# Patient Record
Sex: Female | Born: 1955
Health system: Southern US, Community
[De-identification: ages and names within clinical notes are randomized; demographics above are authoritative.]

## PROBLEM LIST (undated history)

## (undated) DIAGNOSIS — IMO0001 Reserved for inherently not codable concepts without codable children: Secondary | ICD-10-CM

## (undated) DIAGNOSIS — Z87442 Personal history of urinary calculi: Secondary | ICD-10-CM

## (undated) DIAGNOSIS — G4733 Obstructive sleep apnea (adult) (pediatric): Secondary | ICD-10-CM

## (undated) DIAGNOSIS — F32A Depression, unspecified: Secondary | ICD-10-CM

## (undated) DIAGNOSIS — M1990 Unspecified osteoarthritis, unspecified site: Secondary | ICD-10-CM

## (undated) DIAGNOSIS — E119 Type 2 diabetes mellitus without complications: Secondary | ICD-10-CM

## (undated) DIAGNOSIS — T7840XA Allergy, unspecified, initial encounter: Secondary | ICD-10-CM

## (undated) DIAGNOSIS — F329 Major depressive disorder, single episode, unspecified: Secondary | ICD-10-CM

## (undated) DIAGNOSIS — Z531 Procedure and treatment not carried out because of patient's decision for reasons of belief and group pressure: Secondary | ICD-10-CM

## (undated) DIAGNOSIS — J45909 Unspecified asthma, uncomplicated: Secondary | ICD-10-CM

## (undated) DIAGNOSIS — Z860100 Personal history of colon polyps, unspecified: Secondary | ICD-10-CM

## (undated) HISTORY — DX: Allergy, unspecified, initial encounter: T78.40XA

## (undated) HISTORY — PX: BACK SURGERY: SHX140

## (undated) HISTORY — DX: Personal history of urinary calculi: Z87.442

## (undated) HISTORY — PX: OTHER SURGICAL HISTORY: SHX169

## (undated) HISTORY — DX: Personal history of colon polyps, unspecified: Z86.0100

## (undated) HISTORY — DX: Unspecified asthma, uncomplicated: J45.909

## (undated) HISTORY — DX: Major depressive disorder, single episode, unspecified: F32.9

## (undated) HISTORY — DX: Obstructive sleep apnea (adult) (pediatric): G47.33

## (undated) HISTORY — DX: Personal history of colonic polyps: Z86.010

## (undated) HISTORY — DX: Unspecified osteoarthritis, unspecified site: M19.90

## (undated) HISTORY — PX: DILATION AND CURETTAGE OF UTERUS: SHX78

## (undated) HISTORY — PX: TONSILLECTOMY: SUR1361

## (undated) HISTORY — DX: Reserved for inherently not codable concepts without codable children: IMO0001

## (undated) HISTORY — DX: Procedure and treatment not carried out because of patient's decision for reasons of belief and group pressure: Z53.1

## (undated) HISTORY — DX: Depression, unspecified: F32.A

---
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
## 1998-02-09 ENCOUNTER — Other Ambulatory Visit: Admission: RE | Admit: 1998-02-09 | Discharge: 1998-02-09 | Payer: Self-pay | Admitting: *Deleted

## 1998-06-27 ENCOUNTER — Emergency Department (HOSPITAL_COMMUNITY): Admission: EM | Admit: 1998-06-27 | Discharge: 1998-06-27 | Payer: Self-pay | Admitting: Emergency Medicine

## 1998-09-28 ENCOUNTER — Ambulatory Visit (HOSPITAL_COMMUNITY): Admission: RE | Admit: 1998-09-28 | Discharge: 1998-09-28 | Payer: Self-pay | Admitting: Psychiatry

## 1998-10-06 ENCOUNTER — Emergency Department (HOSPITAL_COMMUNITY): Admission: EM | Admit: 1998-10-06 | Discharge: 1998-10-06 | Payer: Self-pay | Admitting: Emergency Medicine

## 1998-10-23 ENCOUNTER — Ambulatory Visit (HOSPITAL_COMMUNITY): Admission: RE | Admit: 1998-10-23 | Discharge: 1998-10-23 | Payer: Self-pay

## 1999-05-04 ENCOUNTER — Emergency Department (HOSPITAL_COMMUNITY): Admission: EM | Admit: 1999-05-04 | Discharge: 1999-05-04 | Payer: Self-pay | Admitting: Emergency Medicine

## 1999-07-08 ENCOUNTER — Encounter: Admission: RE | Admit: 1999-07-08 | Discharge: 1999-09-27 | Payer: Self-pay | Admitting: Anesthesiology

## 1999-07-16 ENCOUNTER — Encounter (INDEPENDENT_AMBULATORY_CARE_PROVIDER_SITE_OTHER): Payer: Self-pay

## 1999-07-16 ENCOUNTER — Ambulatory Visit (HOSPITAL_COMMUNITY): Admission: RE | Admit: 1999-07-16 | Discharge: 1999-07-16 | Payer: Self-pay | Admitting: *Deleted

## 1999-08-07 ENCOUNTER — Ambulatory Visit (HOSPITAL_COMMUNITY): Admission: RE | Admit: 1999-08-07 | Discharge: 1999-08-07 | Payer: Self-pay | Admitting: Internal Medicine

## 1999-08-07 ENCOUNTER — Encounter: Payer: Self-pay | Admitting: Internal Medicine

## 1999-09-27 ENCOUNTER — Encounter: Admission: RE | Admit: 1999-09-27 | Discharge: 1999-12-06 | Payer: Self-pay | Admitting: Anesthesiology

## 1999-10-18 ENCOUNTER — Ambulatory Visit (HOSPITAL_COMMUNITY): Admission: RE | Admit: 1999-10-18 | Discharge: 1999-10-18 | Payer: Self-pay | Admitting: *Deleted

## 1999-10-26 ENCOUNTER — Emergency Department (HOSPITAL_COMMUNITY): Admission: EM | Admit: 1999-10-26 | Discharge: 1999-10-26 | Payer: Self-pay | Admitting: Emergency Medicine

## 1999-11-14 ENCOUNTER — Encounter: Payer: Self-pay | Admitting: *Deleted

## 1999-11-14 ENCOUNTER — Ambulatory Visit (HOSPITAL_COMMUNITY): Admission: RE | Admit: 1999-11-14 | Discharge: 1999-11-14 | Payer: Self-pay | Admitting: *Deleted

## 2000-01-17 ENCOUNTER — Encounter: Admission: RE | Admit: 2000-01-17 | Discharge: 2000-04-16 | Payer: Self-pay | Admitting: Anesthesiology

## 2000-01-19 ENCOUNTER — Ambulatory Visit (HOSPITAL_COMMUNITY): Admission: RE | Admit: 2000-01-19 | Discharge: 2000-01-19 | Payer: Self-pay | Admitting: Internal Medicine

## 2000-01-19 ENCOUNTER — Encounter: Payer: Self-pay | Admitting: Internal Medicine

## 2000-04-16 ENCOUNTER — Encounter: Admission: RE | Admit: 2000-04-16 | Discharge: 2000-07-15 | Payer: Self-pay | Admitting: Anesthesiology

## 2000-07-28 ENCOUNTER — Encounter: Admission: RE | Admit: 2000-07-28 | Discharge: 2000-10-26 | Payer: Self-pay | Admitting: Anesthesiology

## 2000-12-08 ENCOUNTER — Encounter: Admission: RE | Admit: 2000-12-08 | Discharge: 2001-03-08 | Payer: Self-pay | Admitting: Anesthesiology

## 2001-04-06 ENCOUNTER — Encounter: Admission: RE | Admit: 2001-04-06 | Discharge: 2001-05-15 | Payer: Self-pay | Admitting: Anesthesiology

## 2002-02-21 ENCOUNTER — Other Ambulatory Visit: Admission: RE | Admit: 2002-02-21 | Discharge: 2002-02-21 | Payer: Self-pay | Admitting: *Deleted

## 2002-03-04 ENCOUNTER — Ambulatory Visit (HOSPITAL_COMMUNITY): Admission: RE | Admit: 2002-03-04 | Discharge: 2002-03-04 | Payer: Self-pay | Admitting: *Deleted

## 2003-03-09 ENCOUNTER — Other Ambulatory Visit: Admission: RE | Admit: 2003-03-09 | Discharge: 2003-03-09 | Payer: Self-pay | Admitting: *Deleted

## 2003-03-31 ENCOUNTER — Ambulatory Visit (HOSPITAL_COMMUNITY): Admission: RE | Admit: 2003-03-31 | Discharge: 2003-03-31 | Payer: Self-pay | Admitting: *Deleted

## 2006-03-11 ENCOUNTER — Ambulatory Visit (HOSPITAL_COMMUNITY): Admission: RE | Admit: 2006-03-11 | Discharge: 2006-03-11 | Payer: Self-pay | Admitting: *Deleted

## 2010-06-21 ENCOUNTER — Encounter: Admission: RE | Admit: 2010-06-21 | Discharge: 2010-06-21 | Payer: Self-pay | Admitting: Anesthesiology

## 2010-10-05 ENCOUNTER — Encounter: Payer: Self-pay | Admitting: Anesthesiology

## 2011-01-31 NOTE — H&P (Signed)
[facility]  Patient:    Makayla Olson, RAWSON Visit Number: [id number] MRN: [id number]          Service Type: PMG Location: [facility] Attending Physician:  Thyra Breed Adm. Date:  [id number]   CC:         Eliezer Bottom, M.D.  Jaye Beagle, M.D.   History and Physical  FOLLOW-UP EVALUATION  Myrka comes in for follow-up evaluation of her chronic low back pain and status post fusion for spondylosis.  Since her last evaluation she continues to have some burning dysesthesias in the left ankle and left lateral thigh, as well as lower back discomfort, but this is little change from previously.  It is made worse by being up and being active and she has been under a lot of stress with things breaking around the house, emotional problems with her daughter, and depression.  She notes that ice, heat, and laying down as well as her medications helps her reduce her discomfort.  CURRENT MEDICATIONS: 1. OxyContin 40 mg twice a day with 20 mg at midday. 2. Neurontin 100 mg three tablets three times a day. 3. Zoloft 200 mg per day. 4. Ibuprofen p.r.n. which has caused some abdominal discomfort, but no    bleeding and has been resolved by taking some vitamin supplementation.  PHYSICAL EXAMINATION:  VITAL SIGNS:  Blood pressure is 122/77, heart rate is 94, respiratory rate is 16, O2 saturations 97%.  Pain level is 5/10.  EXTREMITIES/NEUROLOGIC:  Straight leg raise signs were negative.  Deep tendon reflexes were symmetric at the knees and ankles.  She has minimal weakness of the left EHL.  IMPRESSION: 1. Low back pain, status post fusion with mild radiculopathy to the left lower    extremity. 2. Depression per Dr. Evelene Croon.  DISPOSITION: 1. Continue on OxyContin 40 mg one twice a day, #60, with 20 mg at midday,    #30. 2. Continue with Neurontin 100 mg three tablets q.8h., #200, with three    refills. 3. Ibuprofen 800 mg one p.o. q.8h., #100, with two  refills. 4. Follow up with me in eight weeks. DD:  05/03/01 TD:  05/03/01 Job: [id number]

## 2011-01-31 NOTE — Consult Note (Signed)
[facility]  Patient:    Makayla Olson, Makayla Olson                     MRN: [id number] Proc. Date: 03/20/00 Adm. Date:  [id number] Attending:  Thyra Breed CC:         Soyla Murphy. Renne Crigler, M.D.             Jaye Beagle, M.D.                          Consultation Report  FOLLOW-UP EVALUATION  HISTORY OF PRESENT ILLNESS:  Wanza comes in for follow-up evaluation of her chronic low back pain with lumbar radiculopathy. Since her previous evaluation, the patient has been evaluated by Dr. Jaye Beagle who is concerned that she may be suffering from a pseudoarthrosis at L5-S1. He has requested that we go ahead and set her up for an L5 selective nerve root block and follow this with an S1 selective nerve root block to ascertain the source of her discomfort.  She continues on the Neurontin 100 mg 2 tablets 3-4 times per day, Clonopin, OxyContin 20 mg b.i.d. with an occasional 10 mg at midday. She does get somewhat nauseated by the medications and sedated but is tolerating this well otherwise. She has no new neurologic symptoms.  PHYSICAL EXAMINATION:  VITAL SIGNS:  Blood pressure 121/55, heart rate 94, respiratory rate 16, O2 saturations 96%, pain level 4/10 and temperature is 98.8.  NEUROLOGIC:  Unchanged from her last visit.  IMPRESSION: 1. Lumbar radiculopathy with chronic low back pain. 2. Dyspepsia and abdominal discomfort. 3. Other medical problems per Dr. Renne Crigler.  DISPOSITION: 1. OxyContin 20 mg 1 p.o. b.i.d. #60 with no refill, OxyContin 10 mg 1 p.o.    midday #30 with no refill. 2. Neurontin 100 mg 2 p.o. q. 6h p.r.n. #100 with 8 refills. 3. Continue on Clonopin. 4. We will go ahead and schedule her initially for an L5 selective    nerve root block and follow this with an S1 if she does not have    significant resolution in her discomfort. DD:  03/20/00 TD:  03/23/00 Job: [id number]

## 2011-01-31 NOTE — Procedures (Signed)
[facility]  Patient:    Makayla Olson, Makayla Olson                     MRN: [id number] Proc. Date: 05/15/00 Adm. Date:  [id number] Attending:  Thyra Breed CC:         Dr. Lyn Hollingshead   Procedure Report  PROCEDURE:  Occipital nerve block.  ANESTHESIOLOGIST:  Thyra Breed, M.D.  DIAGNOSIS:  Occipital neuralgia with headache and lumbar radiculopathy with underlying lumbar spondylosis.  INTERVAL HISTORY:  The patient is noting increasing lower back discomfort radiating out into the left lower extremity.  She states that her discomfort is relatively stable with regard to the occipital neuralgia, although she is beginning to get a tweak of increased discomfort.  She has been applying ice and heat to her neck.  She notes that her sciatica seems much more prominent than in the past.  EXAMINATION:  The patient is afebrile with vital signs stable; please see flow sheet.  The patient demonstrates brisk, symmetric reflexes in the lower extremity with motor 5/5.  She exhibits tenderness of the left occipital groove with good range of motion of her neck and no change in her neurological exam of the upper extremities.  CURRENT MEDICATIONS 1. OxyContin 20 mg q.8h. 2. Neurontin 100 mg two tablets four times a day.  DESCRIPTION OF PROCEDURE:  After informed consent was obtained, the left greater occipital region was prepped with Betadine x 3.  Using a 25-gauge needle, I injected 3 cc of 1% lidocaine in a field distribution nerve block. Fifteen minutes later, the patient noted marked attenuation in discomfort. The needle that was used for the procedure was removed intact.  DISPOSITION 1. Resume previous diet. 2. Limitation in activities per instruction sheet. 3. Continue on current medications with prescriptions written for OxyContin    20 mg one p.o. t.i.d., #90 with no refill, and Neurontin to be taken as    nine tablets per day; she was given prescription for #300  with two refills. 4. Follow up with me in four weeks. DD:  05/15/00 TD:  05/18/00 Job: [id number]

## 2011-01-31 NOTE — Op Note (Signed)
NAMEGAGE, TREIBER                        ACCOUNT NO.:  [id number]   MEDICAL RECORD NO.:  [id number]                   PATIENT TYPE:  AMB   LOCATION:  SDC                                  FACILITY:  [facility]   PHYSICIAN:  Pershing Cox, M.D.            DATE OF BIRTH:  [birth year]   DATE OF PROCEDURE:  03/31/2003  DATE OF DISCHARGE:                                 OPERATIVE REPORT   ANESTHESIA:  General by LMA.   SURGEON:  Pershing Cox, M.D.   INDICATIONS FOR PROCEDURE:  [name] is 55 years old.  She has a  long-standing problem with menorrhagia.  She has had an endometrial ablation  in the past and this year had a hysteroscopy showing complete atrophy of the  endometrium.  Because her bleeding persists, she returns to the operating  room today for repeat cryoablation in hopes that this will stop her bleeding  until she enters menopause. This procedure was done under ultrasound  guidance to be certain that no perforation occurred.   FINDINGS:  Ultrasound shows a small uterus.  The cavity is sounded to 7 cm.  There was no evidence of perforation.   PROCEDURE:  Makayla Olson was brought to the operating room with an IV in  place.  She received a gram of Ancef in the holding area.  Supine on the OR  table, she was allowed to position herself into [place] stirrups to make sure  there was no discomfort in her lower back.  Once she was comfortable, IV  sedation was administered to allow for placement of an LMA.  She was then  prepped with a solution of Hibiclens.  She was draped for a vaginal  procedure and a Foley catheter was sterilely inserted into the bladder.  A  transabdominal ultrasound was performed to allow [place] to visualize the lower  uterine segment and cervix.  This required approximately 150 mL of fluid  injected into the Foley catheter.  A bivalve speculum exposed the cervix.  0.25% Marcaine was injected into the anterior cervix which was grasped with  a single tooth tenaculum.  Marcaine paracervical block was administered at  the 3, 4, 7, and 8 positions using a total volume of 12 mL.  The uterine  sound passed easily into the endometrial cavity under ultrasound guidance.  There was no evidence of perforation.  Serial Pratt dilators were then used  to dilate the cervix to size 25.  Once the dilatation had been completed,  the bivalve speculum was removed and the Sims retractor was used to retract  the posterior vagina.  The probe was precooled for 3 minutes then inserted  into the cavity, first in the direction of the left cornua.  A six minute  freeze was carried out followed by 1 1/2 minutes thaw.  The probe was  repositioned into the right cornua and a six minute freeze was followed by 1  1/2 minute thaw.  The probe was removed.  The tenaculum was removed.  The  patient was extubated and taken to the recovery room in excellent condition.  She had received a gram of Ancef in the holding area for preop antibiotics.                                               Pershing Cox, M.D.    MAJ/MEDQ  D:  03/31/2003  T:  04/01/2003  Job:  [id number]

## 2012-03-01 ENCOUNTER — Other Ambulatory Visit (HOSPITAL_COMMUNITY): Payer: Self-pay | Admitting: Obstetrics

## 2012-03-01 DIAGNOSIS — Z1231 Encounter for screening mammogram for malignant neoplasm of breast: Secondary | ICD-10-CM

## 2012-03-15 LAB — HM PAP SMEAR: HM Pap smear: NORMAL

## 2012-03-24 ENCOUNTER — Ambulatory Visit (HOSPITAL_COMMUNITY)
Admission: RE | Admit: 2012-03-24 | Discharge: 2012-03-24 | Disposition: A | Discharge status: Home / Self Care | Payer: Medicare Other | Source: Ambulatory Visit | Attending: Obstetrics | Admitting: Obstetrics

## 2012-03-24 DIAGNOSIS — Z1231 Encounter for screening mammogram for malignant neoplasm of breast: Secondary | ICD-10-CM | POA: Insufficient documentation

## 2012-12-06 ENCOUNTER — Other Ambulatory Visit: Payer: Self-pay | Admitting: Orthopedic Surgery

## 2012-12-06 DIAGNOSIS — M545 Low back pain: Secondary | ICD-10-CM

## 2012-12-08 ENCOUNTER — Other Ambulatory Visit: Payer: Medicare Other

## 2012-12-15 ENCOUNTER — Ambulatory Visit
Admission: RE | Admit: 2012-12-15 | Discharge: 2012-12-15 | Disposition: A | Discharge status: Home / Self Care | Payer: Medicare Other | Source: Ambulatory Visit | Attending: Orthopedic Surgery | Admitting: Orthopedic Surgery

## 2012-12-15 DIAGNOSIS — M545 Low back pain: Secondary | ICD-10-CM

## 2013-03-14 ENCOUNTER — Emergency Department (HOSPITAL_COMMUNITY): Payer: Medicare Other

## 2013-03-14 ENCOUNTER — Emergency Department (HOSPITAL_COMMUNITY)
Admission: EM | Admit: 2013-03-14 | Discharge: 2013-03-14 | Disposition: A | Discharge status: Home / Self Care | Payer: Medicare Other | Attending: Emergency Medicine | Admitting: Emergency Medicine

## 2013-03-14 ENCOUNTER — Encounter (HOSPITAL_COMMUNITY): Payer: Self-pay | Admitting: *Deleted

## 2013-03-14 DIAGNOSIS — K5900 Constipation, unspecified: Secondary | ICD-10-CM | POA: Insufficient documentation

## 2013-03-14 DIAGNOSIS — Z79899 Other long term (current) drug therapy: Secondary | ICD-10-CM | POA: Insufficient documentation

## 2013-03-14 DIAGNOSIS — R110 Nausea: Secondary | ICD-10-CM | POA: Insufficient documentation

## 2013-03-14 DIAGNOSIS — R109 Unspecified abdominal pain: Secondary | ICD-10-CM | POA: Insufficient documentation

## 2013-03-14 DIAGNOSIS — R34 Anuria and oliguria: Secondary | ICD-10-CM | POA: Insufficient documentation

## 2013-03-14 DIAGNOSIS — E119 Type 2 diabetes mellitus without complications: Secondary | ICD-10-CM | POA: Insufficient documentation

## 2013-03-14 HISTORY — DX: Type 2 diabetes mellitus without complications: E11.9

## 2013-03-14 LAB — CBC WITH DIFFERENTIAL/PLATELET
Eosinophils Absolute: 0.2 10*3/uL (ref 0.0–0.7)
HCT: 41 % (ref 36.0–46.0)
Hemoglobin: 13.8 g/dL (ref 12.0–15.0)
Lymphs Abs: 1.6 10*3/uL (ref 0.7–4.0)
MCH: 27.9 pg (ref 26.0–34.0)
Monocytes Absolute: 0.4 10*3/uL (ref 0.1–1.0)
Monocytes Relative: 7 % (ref 3–12)
Neutrophils Relative %: 59 % (ref 43–77)
RBC: 4.94 MIL/uL (ref 3.87–5.11)

## 2013-03-14 LAB — BASIC METABOLIC PANEL
BUN: 17 mg/dL (ref 6–23)
Chloride: 102 mEq/L (ref 96–112)
GFR calc non Af Amer: >90 mL/min (ref >90)
Glucose, Bld: 187 mg/dL — ABNORMAL HIGH (ref 70–99)
Potassium: 4.2 mEq/L (ref 3.5–5.1)

## 2013-03-14 LAB — URINALYSIS, ROUTINE W REFLEX MICROSCOPIC
Bilirubin Urine: NEGATIVE
Glucose, UA: NEGATIVE mg/dL
Hgb urine dipstick: NEGATIVE
Nitrite: NEGATIVE
Specific Gravity, Urine: 1.022 (ref 1.005–1.030)
pH: 5 (ref 5.0–8.0)

## 2013-03-14 LAB — URINE MICROSCOPIC-ADD ON

## 2013-03-14 MED ORDER — ONDANSETRON HCL 4 MG/2ML IJ SOLN
4.0000 mg | Freq: Once | INTRAMUSCULAR | Status: AC
  Administered 2013-03-14: 4 mg via INTRAVENOUS
  Filled 2013-03-14: qty 2

## 2013-03-14 MED ORDER — HYDROMORPHONE HCL PF 2 MG/ML IJ SOLN
2.0000 mg | Freq: Once | INTRAMUSCULAR | Status: AC
  Administered 2013-03-14: 2 mg via INTRAVENOUS
  Filled 2013-03-14: qty 1

## 2013-03-14 MED ORDER — HYDROMORPHONE HCL PF 1 MG/ML IJ SOLN: 1.0000 mg | Freq: Once | INTRAMUSCULAR | Status: DC

## 2013-03-14 NOTE — ED Notes (Signed)
Pt reports left sided flank pain 9/10 started last night, now radiates to left side of abdomen. Nausea present. No hx of kidney stones. Family hx of kidney stones. Reports increased difficulty urinating, denies dysuria.

## 2013-03-14 NOTE — ED Provider Notes (Signed)
Medical screening examination/treatment/procedure(s) were performed by non-physician practitioner and as supervising physician I was immediately available for consultation/collaboration.   Loren Racer, MD 03/14/13 [phone]

## 2013-03-14 NOTE — ED Notes (Signed)
PA at bedside.

## 2013-03-14 NOTE — ED Provider Notes (Signed)
History    CSN: [id number] Arrival date & time 03/14/13  1128  First MD Initiated Contact with Patient 03/14/13 1150     Chief Complaint  Patient presents with  . Flank Pain  . Abdominal Pain   (Consider location/radiation/quality/duration/timing/severity/associated sxs/prior Treatment) HPI Comments: Pt reports she has not felt well for two days, stating she has been sleeping a lot, then last night around 7:30, she developed sharp pain over her left flank.  Pt radiates into her left abdomen .  Pain has been constant, has improved somewhat with her chronic pain medications.  Pain is exacerbated with palpation, movement, and deep inspiration.  She is also having decreased urination and bowel movements and is having to push to urinate.  Last BM was yesterday morning and was normal, states she usually has 3-4 stools/day but does "not even have the urge."  Denies dysuria, frequency, or urgency.  Denies fever, chills, body aches, CP, SOB, cough, leg swelling.  No personal of family hx blood clots, no recent immobilization.   The history is provided by the patient and a relative.   Past Medical History  Diagnosis Date  . Diabetes mellitus without complication     controlled with diet   Past Surgical History  Procedure Laterality Date  . Back surgery      4 back surgeries, last on 2012, 8 screws and rods, spinal fusion   History reviewed. No pertinent family history. History  Substance Use Topics  . Smoking status: Never Smoker   . Smokeless tobacco: Not on file  . Alcohol Use: No   OB History   Grav Para Term Preterm Abortions TAB SAB Ect Mult Living                 Review of Systems  Constitutional: Negative for fever and chills.  Respiratory: Negative for cough and shortness of breath.   Cardiovascular: Negative for chest pain.  Gastrointestinal: Positive for nausea, abdominal pain and constipation. Negative for vomiting and diarrhea.  Genitourinary: Positive for decreased  urine volume. Negative for dysuria, urgency and frequency.  Musculoskeletal: Positive for back pain.       Chronic back pain, unchanged    Allergies  Review of patient's allergies indicates no known allergies.  Home Medications  No current outpatient prescriptions on file. BP 133/74  Pulse 95  Temp(Src) 97.6 F (36.4 C) (Oral)  Resp 16  SpO2 97% Physical Exam  Nursing note and vitals reviewed. Constitutional: She appears well-developed and well-nourished. No distress.  HENT:  Head: Normocephalic and atraumatic.  Neck: Neck supple.  Cardiovascular: Normal rate and regular rhythm.   Pulmonary/Chest: Effort normal and breath sounds normal. No respiratory distress. She has no wheezes. She has no rales.  Abdominal: Soft. She exhibits no distension. There is tenderness. There is CVA tenderness. There is no rebound and no guarding.  Left CVA tenderness.  Mild diffuse left sided abdominal tenderness.    Musculoskeletal:       Arms: Pt reproduced with palpation  Neurological: She is alert.  Skin: She is not diaphoretic.    ED Course  Procedures (including critical care time) Labs Reviewed  URINALYSIS, ROUTINE W REFLEX MICROSCOPIC - Abnormal; Notable for the following:    Leukocytes, UA SMALL (*)    All other components within normal limits  BASIC METABOLIC PANEL - Abnormal; Notable for the following:    Glucose, Bld 187 (*)    All other components within normal limits  URINE MICROSCOPIC-ADD ON - Abnormal;  Notable for the following:    Squamous Epithelial / LPF FEW (*)    All other components within normal limits  CBC WITH DIFFERENTIAL   Ct Abdomen Pelvis Wo Contrast  03/14/2013   *RADIOLOGY REPORT*  Clinical Data: Left flank pain radiating to the left mid abdomen since yesterday, some nausea and vomiting  CT ABDOMEN AND PELVIS WITHOUT CONTRAST  Technique:  Multidetector CT imaging of the abdomen and pelvis was performed following the standard protocol without intravenous  contrast.  Comparison: None.  Findings: The lung bases are clear.  The liver is somewhat low in attenuation suggesting fatty infiltration with some sparing near the gallbladder.  No ductal dilatation is seen.  No calcified gallstones are noted.  The pancreas is somewhat fatty infiltrated but the pancreatic duct is not dilated.  The right adrenal gland is normal in size and probable left incidental adrenal adenoma is present of 13 mm in diameter.  The spleen is normal in size.  The stomach is decompressed.  No hydronephrosis is noted.  There may be a small faint calculus within the upper pole of the right kidney. No other definite calculi are seen.  The proximal ureters are normal in caliber.  The abdominal aorta is normal in caliber.  No adenopathy is seen.  The distal ureters are normal in caliber and no distal ureteral calculus is seen.  The urinary bladder is unremarkable.  The uterus is normal in size.  No adnexal lesion is noted.  There is no free fluid in the pelvis.  No abnormality of the colon is noted.  The terminal ileum is unremarkable as is the appendix.  Hardware for posterior fusion from L3-S1 is noted.  IMPRESSION:  1.  Possible tiny nonobstructing calculus in the upper pole of the right kidney.  No other calculi and no hydronephrosis is noted. 2.  Diffuse fatty infiltration of the liver with some areas of sparing. 3.  The appendix and terminal ileum are unremarkable.   Original Report Authenticated By: Dwyane Dee, M.D.    2:29 PM Pt reports she is pain free currently.  Reexamination of abdomen is unchanged, very mild tenderness.  No distension, no guarding, no rebound.  Left flank tenderness improved.    1. Left flank pain     MDM  Pt with left flank pain with radiation into her left abdomen.  Abdominal exam is benign - mild diffuse left sided tenderness.  Left flank is tender, exacerbated with palpation.  Pt denies any change in her chronic back pain and pain is not in the location of her  chronic back pain.  Pt notes pain is worse with deep inspiration, but I have spoken with her at length about risk factors and symptoms of PE and pt denies these - denies CP, SOB, cough, hemoptysis.  Denies any risk factors for PE.  Pt does have tiny stone in right kidney, thus possibility for making stones - though no e/o stone on left.  UA shows small leukocytes and 3-6 WBC but with few squamous epithelial cells  - will send for culture.  Hyperglycemia.  Otherwise CBC and BMP normal.  Possible constipation, musculoskeletal cause. Given patient's improvement and no other current symptoms, I feel pt may safely be discharged.  Discussed all results with patient.  Pt given return precautions - discussed return precautions at length with patient and family member.  Pt verbalizes understanding and agrees with plan.     I doubt any other EMC precluding discharge at this time  including, but not necessarily limited to the following:  PE, pneumonia, SBO, colitis.  Trixie Dredge, PA-C 03/14/13 1436  [place], PA-C 03/14/13 1436

## 2013-03-15 LAB — URINE CULTURE: Colony Count: 60000

## 2013-04-27 ENCOUNTER — Emergency Department (HOSPITAL_BASED_OUTPATIENT_CLINIC_OR_DEPARTMENT_OTHER)
Admission: EM | Admit: 2013-04-27 | Discharge: 2013-04-27 | Disposition: A | Discharge status: Home / Self Care | Payer: No Typology Code available for payment source | Attending: Emergency Medicine | Admitting: Emergency Medicine

## 2013-04-27 ENCOUNTER — Emergency Department (HOSPITAL_BASED_OUTPATIENT_CLINIC_OR_DEPARTMENT_OTHER): Payer: No Typology Code available for payment source

## 2013-04-27 ENCOUNTER — Encounter (HOSPITAL_BASED_OUTPATIENT_CLINIC_OR_DEPARTMENT_OTHER): Payer: Self-pay | Admitting: *Deleted

## 2013-04-27 DIAGNOSIS — Y92410 Unspecified street and highway as the place of occurrence of the external cause: Secondary | ICD-10-CM | POA: Insufficient documentation

## 2013-04-27 DIAGNOSIS — M549 Dorsalgia, unspecified: Secondary | ICD-10-CM

## 2013-04-27 DIAGNOSIS — E119 Type 2 diabetes mellitus without complications: Secondary | ICD-10-CM | POA: Insufficient documentation

## 2013-04-27 DIAGNOSIS — Z79899 Other long term (current) drug therapy: Secondary | ICD-10-CM | POA: Insufficient documentation

## 2013-04-27 DIAGNOSIS — IMO0002 Reserved for concepts with insufficient information to code with codable children: Secondary | ICD-10-CM | POA: Insufficient documentation

## 2013-04-27 DIAGNOSIS — Z9889 Other specified postprocedural states: Secondary | ICD-10-CM | POA: Insufficient documentation

## 2013-04-27 DIAGNOSIS — S0993XA Unspecified injury of face, initial encounter: Secondary | ICD-10-CM | POA: Insufficient documentation

## 2013-04-27 DIAGNOSIS — Y9389 Activity, other specified: Secondary | ICD-10-CM | POA: Insufficient documentation

## 2013-04-27 LAB — URINE MICROSCOPIC-ADD ON

## 2013-04-27 LAB — URINALYSIS, ROUTINE W REFLEX MICROSCOPIC
Bilirubin Urine: NEGATIVE
Nitrite: NEGATIVE
Urobilinogen, UA: 0.2 mg/dL (ref 0.0–1.0)

## 2013-04-27 MED ORDER — IBUPROFEN 800 MG PO TABS: 800.0000 mg | ORAL_TABLET | Freq: Three times a day (TID) | ORAL | Status: DC
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
## 2013-05-24 ENCOUNTER — Ambulatory Visit: Payer: Medicare Other | Admitting: Family

## 2013-05-27 ENCOUNTER — Ambulatory Visit (INDEPENDENT_AMBULATORY_CARE_PROVIDER_SITE_OTHER): Payer: Medicare Other | Admitting: Family

## 2013-05-27 ENCOUNTER — Encounter: Payer: Self-pay | Admitting: Family

## 2013-05-27 VITALS — BP 120/78 | HR 90 | Temp 98.9°F | Resp 18 | Ht 60.0 in

## 2013-05-27 DIAGNOSIS — Z531 Procedure and treatment not carried out because of patient's decision for reasons of belief and group pressure: Secondary | ICD-10-CM

## 2013-05-27 DIAGNOSIS — G894 Chronic pain syndrome: Secondary | ICD-10-CM

## 2013-05-27 DIAGNOSIS — L659 Nonscarring hair loss, unspecified: Secondary | ICD-10-CM

## 2013-05-27 DIAGNOSIS — E119 Type 2 diabetes mellitus without complications: Secondary | ICD-10-CM

## 2013-05-27 DIAGNOSIS — F329 Major depressive disorder, single episode, unspecified: Secondary | ICD-10-CM

## 2013-05-27 DIAGNOSIS — IMO0001 Reserved for inherently not codable concepts without codable children: Secondary | ICD-10-CM | POA: Insufficient documentation

## 2013-05-27 DIAGNOSIS — F3289 Other specified depressive episodes: Secondary | ICD-10-CM

## 2013-05-27 DIAGNOSIS — J45909 Unspecified asthma, uncomplicated: Secondary | ICD-10-CM

## 2013-05-27 DIAGNOSIS — G4733 Obstructive sleep apnea (adult) (pediatric): Secondary | ICD-10-CM

## 2013-05-27 HISTORY — DX: Obstructive sleep apnea (adult) (pediatric): G47.33

## 2013-05-27 LAB — CBC WITH DIFFERENTIAL/PLATELET
Basophils Absolute: 0 10*3/uL (ref 0.0–0.1)
Basophils Relative: 1 % (ref 0–1)
Eosinophils Absolute: 0.5 10*3/uL (ref 0.0–0.7)
HCT: 39 % (ref 36.0–46.0)
Hemoglobin: 13.9 g/dL (ref 12.0–15.0)
MCH: 28.8 pg (ref 26.0–34.0)
MCHC: 35.6 g/dL (ref 30.0–36.0)
Monocytes Absolute: 0.4 10*3/uL (ref 0.1–1.0)
Monocytes Relative: 6 % (ref 3–12)
Neutrophils Relative %: 50 % (ref 43–77)
RDW: 13.8 % (ref 11.5–15.5)

## 2013-05-27 LAB — BASIC METABOLIC PANEL WITH GFR
CO2: 28 mEq/L (ref 19–32)
Chloride: 104 mEq/L (ref 96–112)
Creat: 0.7 mg/dL (ref 0.50–1.10)
GFR, Est Non African American: >89 mL/min
Sodium: 139 mEq/L (ref 135–145)

## 2013-05-27 LAB — HEMOGLOBIN A1C
Hgb A1c MFr Bld: 7.2 % — ABNORMAL HIGH (ref <5.7)
Mean Plasma Glucose: 160 mg/dL — ABNORMAL HIGH (ref <117)

## 2013-05-27 LAB — T4, FREE: Free T4: 1.1 ng/dL (ref 0.80–1.80)

## 2013-05-27 LAB — T3, FREE: T3, Free: 3.4 pg/mL (ref 2.3–4.2)

## 2013-05-27 MED ORDER — ALBUTEROL SULFATE HFA 108 (90 BASE) MCG/ACT IN AERS: 2.0000 | INHALATION_SPRAY | Freq: Four times a day (QID) | RESPIRATORY_TRACT | Status: DC | PRN

## 2013-05-27 NOTE — Assessment & Plan Note (Signed)
Followed by Dr. Thyra Breed, pain management.

## 2013-05-27 NOTE — Assessment & Plan Note (Signed)
Pt reports well controlled with use of CPAP

## 2013-05-27 NOTE — Assessment & Plan Note (Signed)
Hair loss plus other symptoms, concerning for hypothyroid, will check tft's.

## 2013-05-27 NOTE — Patient Instructions (Addendum)
Please complete your lab work prior to leaving. Follow up in 1 month for a fasting physical. Welcome to [facility]!

## 2013-05-27 NOTE — Progress Notes (Signed)
Subjective:    Patient ID: Makayla Olson, female    DOB: 08-04-1956, 57 y.o.   MRN: [id number]  HPI  Makayla Olson is a 57 yr old female who presents today to establish care.  Most recently she has been followed by [facility].    Alopecia-  Pt reports hair loss since 2009 and worsening despite use of monoxidil.Uses a laser comb and minoxidil.  Has dry skin, brittle nails, depression, doesn't tolerate cold or heat, insomnia, difficulty thinking, hoarseness. She reports feeling exhausted.  She has OSA and has been on cpap x 18 mos.  Asthma- reports that she used to have regular hospitalizations for asthma.  In 1992 she started a regimen of chinese herbs and symptoms have resolved.  Reports that she had " bad flu" last month, has had to use her inhaler and is requesting a refill.   Depression- husband of 20 yrs left in 1999.  Has suffered with depression since that time.  She is on zoloft.  She has been on paxil in the past. Doesn't wish to change medication.  Reports that her depression is controlled generally, worsened by pain and worry about her hair falling out.  She has a service dog who helps her.  Has insomnia.    DM2- reports that this is diet controlled.    Chronic pain- reports that she has nerve damage in both legs due to chronic back pain/fusions.  She sees [facility]- Dr. Vear Clock.     Colon polyps- per colo 2013 per pt. This was done at [facility]. Dr. Jasmine December.  Review of Systems  Constitutional: Negative for unexpected weight change.  HENT: Negative for congestion.   Eyes: Negative for visual disturbance.  Respiratory: Positive for cough.   Cardiovascular: Negative for leg swelling.  Gastrointestinal: Negative for constipation.  Genitourinary: Negative for dysuria.  Musculoskeletal: Positive for back pain.  Neurological: Negative for headaches.  Hematological:       Notes some swelling in front of her ears.     Past Medical History  Diagnosis Date  . Diabetes  mellitus without complication     controlled with diet  . Asthma   . Arthritis   . Depression   . Allergy   . Personal history of kidney stones   . Personal history of colonic polyps   . Refusal of blood transfusions as patient is Jehovah's Witness   . OSA (obstructive sleep apnea) 05/27/2013    History   Social History  . Marital Status: Single    Spouse Name: N/A    Number of Children: N/A  . Years of Education: N/A   Occupational History  . Not on file.   Social History Main Topics  . Smoking status: Never Smoker   . Smokeless tobacco: Never Used  . Alcohol Use: Yes     Comment: rarely  . Drug Use: No  . Sexual Activity: No   Other Topics Concern  . Not on file   Social History Narrative   Divorced   2 grown children- daughter lives locally, son lives near [address].   Enjoys painting, sewing, reading, puzzles   Completed some college.   Lives alone.    Past Surgical History  Procedure Laterality Date  . Back surgery  1990, 2000, 2001, 2012    4 back surgeries, last on 2012, 8 screws and rods, spinal fusion  . Miscarrigae      x 3 with D&C  . Dilation and curettage of uterus  x 3  . Tonsillectomy    . Tonsillectomy  1960    Family History  Problem Relation Age of Onset  . Cancer Mother     breast  . Arthritis Mother   . Diabetes Father   . Cancer Maternal Aunt     ? type  . Cancer Maternal Uncle     pancreas  . Kidney disease Paternal Uncle   . Diabetes Paternal Grandmother     Allergies  Allergen Reactions  . Other Anaphylaxis    [place] nuts, ?filaberts and hazelnuts  . Plum Pulp Anaphylaxis  . Shellfish-Derived Products Anaphylaxis  . Theophyllines Shortness Of Breath and Diarrhea  . Darvocet [Propoxyphene-Acetaminophen] Other (See Comments)    halluciations  . Ivp Dye [Iodinated Diagnostic Agents]     Current Outpatient Prescriptions on File Prior to Visit  Medication Sig Dispense Refill  . gabapentin (NEURONTIN) 300 MG  capsule Take 300 mg by mouth 3 (three) times daily.      Marland Kitchen oxyCODONE (OXY IR/ROXICODONE) 5 MG immediate release tablet Take 20 mg by mouth every 6 (six) hours as needed for pain (break through pain).      . OxyCODONE (OXYCONTIN) 15 mg T12A Take 15 mg by mouth 3 (three) times daily.      . sertraline (ZOLOFT) 100 MG tablet Take 100 mg by mouth daily. Takes in pm      . ibuprofen (ADVIL,MOTRIN) 800 MG tablet Take 1 tablet (800 mg total) by mouth 3 (three) times daily.  21 tablet  0   No current facility-administered medications on file prior to visit.    BP 120/78  Pulse 90  Temp(Src) 98.9 F (37.2 C) (Oral)  Resp 18  Ht 5' (1.524 m)  SpO2 97%        Objective:   Physical Exam  Constitutional: She is oriented to person, place, and time. She appears well-developed and well-nourished. No distress.  HENT:  Head: Normocephalic and atraumatic.  Right Ear: Tympanic membrane and ear canal normal.  Left Ear: Tympanic membrane and ear canal normal.  Mouth/Throat: No oropharyngeal exudate, posterior oropharyngeal edema or posterior oropharyngeal erythema.  Cardiovascular: Normal rate and regular rhythm.   No murmur heard. Pulmonary/Chest: Effort normal. No respiratory distress. She has wheezes in the right middle field. She has no rales. She exhibits no tenderness.  Musculoskeletal: She exhibits no edema.  Neurological: She is alert and oriented to person, place, and time.  Skin: Skin is warm and dry.  Mild eczema noted right inner eyelid  Hair is thin  Psychiatric: She has a normal mood and affect. Her behavior is normal. Judgment and thought content normal.          Assessment & Plan:

## 2013-05-27 NOTE — Assessment & Plan Note (Signed)
Diet controlled per pt.  Check A1C, urine microalbumin, bmet.

## 2013-05-27 NOTE — Assessment & Plan Note (Signed)
R sided expiratory wheeze is noted.  Refill provided for albuterol.

## 2013-05-27 NOTE — Assessment & Plan Note (Signed)
Fair control on zoloft. We discussed possibility of changing medication and she does not wish to at this time.

## 2013-05-28 LAB — MICROALBUMIN / CREATININE URINE RATIO
Creatinine, Urine: 151.9 mg/dL
Microalb Creat Ratio: 3.3 mg/g (ref 0.0–30.0)

## 2013-05-30 ENCOUNTER — Encounter: Payer: Self-pay | Admitting: Family

## 2013-05-31 ENCOUNTER — Telehealth: Payer: Self-pay | Admitting: *Deleted

## 2013-05-31 DIAGNOSIS — R490 Dysphonia: Secondary | ICD-10-CM

## 2013-05-31 NOTE — Telephone Encounter (Signed)
Pt called back and left message on voicemail that she is also concerned about insomnia. Has trouble falling asleep and staying asleep due to her pain but states pain management is taking care of her pain.  "Doesn't want to add another medication and was thinking maybe the insomnia was linked to something else that may be causing her dry skin, brittle nails and hair loss". Saw dermatology in the past that told her "there is nothing else they could recommend for her hair loss other than the minoxidil". Also notes intolerance to heat and cold. Pt wonders if she could have a hormone problem causing these symptoms?  Feels like she used to have an excellent memory, now has trouble remembering conversations with her daughter. Feels like she has difficulty making decisions because she keeps trying to remember if she is forgetting anything.   Concerned about how frequently she seems to lose her voice. Cold drinks do not help but feels warm liquids do. Reports that she will still lose her voice by the end of the day. Reports "dilated esophagus a few years ago". Took protonix for ulcer in the past and felt it made her gastritis worse. Started eating yogurt 1-2 x a week to keep her gastritis controlled and that seems to work fine for her per pt report. Denies reflux.  Pt states she has dealt with these symptoms for years and they seem to be worsening. Feels tired of dealing with these symptoms and not getting any answers.  Please advise.

## 2013-05-31 NOTE — Telephone Encounter (Addendum)
Phoned patient 05/31/13.  She tells me that she has tried melatonin in the past as well as kava kava.  She feels like sleep issues are related to pain.  I advised pt that other than follow up with her pain doctor, given the fact that she does not wish to add "sleep pill," I do not have much more to offer.  She verbalizes understanding.  In regards to memory issues, we discussed bringing the patient back in for formal memory testing. She will think about this and let us know.  Alopecia- she declines derm referral.  Wants to know if there is a hormone problem. Discussed that she is menopausal so she has low estrogen.  We did discuss referral to endo, though I advised pt that I am not sure that they would have much more to offer.  She wishes to hold off on this referral.   Hoarseness- Discussed referral to ENT- she is agreeable to proceed.  She would like referral.

## 2013-05-31 NOTE — Telephone Encounter (Signed)
Noted. As her thyroid studies are normal, I would recommend referral to derm in regards to hair loss,dry skin and brittle nails. Let me know.

## 2013-05-31 NOTE — Telephone Encounter (Signed)
Received message from pt on 05/30/13 to call her back re: next steps and her symptoms.  Received another message from pt at 10:30 today. Pt was upset that I had not called her back yet and states she is really suffering and wants to know what she should do next?  Attempted to reach pt and left detailed message apologizing for the delay in getting back with her and to please call me back with additional concerns.

## 2013-06-03 ENCOUNTER — Telehealth: Payer: Self-pay | Admitting: *Deleted

## 2013-06-03 MED ORDER — ALBUTEROL SULFATE (2.5 MG/3ML) 0.083% IN NEBU: 2.5000 mg | INHALATION_SOLUTION | Freq: Four times a day (QID) | RESPIRATORY_TRACT | Status: AC | PRN

## 2013-06-03 MED ORDER — AMBULATORY NON FORMULARY MEDICATION: Status: DC

## 2013-06-03 NOTE — Telephone Encounter (Signed)
Nebulizer rx faxed to [facility] and albuterol nebs sent via eRx. Attempted to notify pt and received prompt to enter "remote access code". Unable to leave message.

## 2013-06-03 NOTE — Telephone Encounter (Signed)
Received message from pt stating she is unable to find her nebulizer machine. States that she has used this for her asthma and received it through her previous doctor. Pt would like rx sent for nebulizer machine and albuterol nebs. Pt states she was not established with a home health agency prior but received rx from her pharmacy in [place].  Please advise.

## 2013-06-03 NOTE — Telephone Encounter (Signed)
I have sent rx for both to her pharmacy.

## 2013-06-06 NOTE — Telephone Encounter (Signed)
Spoke to the pt.  She notified me that the pharmacy was unable to bill for the nebulizer.  She currently has her cpap through [facility].  Would like to get her neb through them.  She will call me back with a telephone number.

## 2013-06-09 NOTE — Telephone Encounter (Signed)
Contacted [facility] [employer] at ph) [phone]. Confirmed pt is existing customer and verified fax) [phone]. Rx faxed and pt notified.

## 2013-06-10 ENCOUNTER — Telehealth: Payer: Self-pay | Admitting: *Deleted

## 2013-06-10 NOTE — Telephone Encounter (Signed)
Received fax from [facility] to complete order for pt's nebulizer and nebulizer supplies (tubing, mask, filters). Form should be faxed to [phone].  Form forwarded to Provider for signature.

## 2013-06-14 NOTE — Telephone Encounter (Signed)
This has been signed.

## 2013-06-17 NOTE — Telephone Encounter (Signed)
Form was faxed to below # on 06/14/13.

## 2013-06-28 ENCOUNTER — Telehealth: Payer: Self-pay | Admitting: *Deleted

## 2013-06-28 NOTE — Telephone Encounter (Signed)
Received message from Tia at [facility], ph) [phone].  She requested recent records to support order for nebulizer machine. Pt has only seen [place] 1 time and it was to establish care. No mention was made to [place] at that time that pt was using a nebulizer. Pt called back a few days later feeling her asthma was flaring up and stated that she no longer had a nebulizer machine and would like [place] to send Rx for one. Rxs were sent. Received call from [place] with [facility] that stated insurance will not pay for current order as they need specific documentation re: pt's need (dx) for nebulizer.  Attempted to notify pt and left detailed message on home # to call and schedule appt to discuss and document need for nebulizer or let me know how she wants to proceed.

## 2013-06-28 NOTE — Telephone Encounter (Signed)
Spoke with Tia from [facility] requesting recent office notes to support order for nebulizer. Notes printed and faxed to [phone].

## 2013-07-01 NOTE — Telephone Encounter (Signed)
Spoke with pt re: below message. Pt states she sees an Financial risk analyst doctor Gustavus Messing) that has treated her for asthma and she will be seeing her next week. She will let us know if they can take care of request. If not, she will schedule follow up with [place].  Pt states that she used to take Albuterol tablets? Years ago and they seemed to "hold her open longer" and is requesting rx for this?  Please advise.

## 2013-07-21 ENCOUNTER — Other Ambulatory Visit: Payer: Self-pay

## 2014-01-06 ENCOUNTER — Encounter (HOSPITAL_BASED_OUTPATIENT_CLINIC_OR_DEPARTMENT_OTHER): Payer: Self-pay | Admitting: Emergency Medicine

## 2014-01-06 ENCOUNTER — Emergency Department (HOSPITAL_BASED_OUTPATIENT_CLINIC_OR_DEPARTMENT_OTHER)
Admission: EM | Admit: 2014-01-06 | Discharge: 2014-01-06 | Disposition: A | Discharge status: Home / Self Care | Payer: Medicare Other | Attending: Emergency Medicine | Admitting: Emergency Medicine

## 2014-01-06 ENCOUNTER — Emergency Department (HOSPITAL_BASED_OUTPATIENT_CLINIC_OR_DEPARTMENT_OTHER): Payer: Medicare Other

## 2014-01-06 DIAGNOSIS — J45909 Unspecified asthma, uncomplicated: Secondary | ICD-10-CM | POA: Insufficient documentation

## 2014-01-06 DIAGNOSIS — F329 Major depressive disorder, single episode, unspecified: Secondary | ICD-10-CM | POA: Diagnosis not present

## 2014-01-06 DIAGNOSIS — Y929 Unspecified place or not applicable: Secondary | ICD-10-CM | POA: Insufficient documentation

## 2014-01-06 DIAGNOSIS — Y9301 Activity, walking, marching and hiking: Secondary | ICD-10-CM | POA: Diagnosis not present

## 2014-01-06 DIAGNOSIS — G8929 Other chronic pain: Secondary | ICD-10-CM | POA: Diagnosis not present

## 2014-01-06 DIAGNOSIS — M549 Dorsalgia, unspecified: Secondary | ICD-10-CM

## 2014-01-06 DIAGNOSIS — S8990XA Unspecified injury of unspecified lower leg, initial encounter: Secondary | ICD-10-CM | POA: Diagnosis present

## 2014-01-06 DIAGNOSIS — Z860100 Personal history of colon polyps, unspecified: Secondary | ICD-10-CM | POA: Insufficient documentation

## 2014-01-06 DIAGNOSIS — Z87442 Personal history of urinary calculi: Secondary | ICD-10-CM | POA: Diagnosis not present

## 2014-01-06 DIAGNOSIS — W010XXA Fall on same level from slipping, tripping and stumbling without subsequent striking against object, initial encounter: Secondary | ICD-10-CM | POA: Diagnosis not present

## 2014-01-06 DIAGNOSIS — Z79899 Other long term (current) drug therapy: Secondary | ICD-10-CM | POA: Insufficient documentation

## 2014-01-06 DIAGNOSIS — M069 Rheumatoid arthritis, unspecified: Secondary | ICD-10-CM | POA: Diagnosis not present

## 2014-01-06 DIAGNOSIS — Z791 Long term (current) use of non-steroidal anti-inflammatories (NSAID): Secondary | ICD-10-CM | POA: Insufficient documentation

## 2014-01-06 DIAGNOSIS — E119 Type 2 diabetes mellitus without complications: Secondary | ICD-10-CM | POA: Insufficient documentation

## 2014-01-06 DIAGNOSIS — S8000XA Contusion of unspecified knee, initial encounter: Secondary | ICD-10-CM | POA: Insufficient documentation

## 2014-01-06 DIAGNOSIS — IMO0002 Reserved for concepts with insufficient information to code with codable children: Secondary | ICD-10-CM | POA: Diagnosis not present

## 2014-01-06 DIAGNOSIS — F3289 Other specified depressive episodes: Secondary | ICD-10-CM | POA: Insufficient documentation

## 2014-01-06 DIAGNOSIS — W19XXXA Unspecified fall, initial encounter: Secondary | ICD-10-CM

## 2014-01-06 DIAGNOSIS — S8002XA Contusion of left knee, initial encounter: Secondary | ICD-10-CM

## 2014-01-06 NOTE — Discharge Instructions (Signed)

## 2014-01-06 NOTE — ED Notes (Addendum)
Last night she was walking up steps and her leg slipped. She fell and hit her lower back. C/o left knee injury.

## 2014-01-06 NOTE — ED Provider Notes (Signed)
CSN: [id number]     Arrival date & time 01/06/14  1558 History   First MD Initiated Contact with Patient 01/06/14 1630     Chief Complaint  Patient presents with  . Fall     (Consider location/radiation/quality/duration/timing/severity/associated sxs/prior Treatment) Patient is a 58 y.o. female presenting with fall.  Fall   Pt with history of chronic back pain, reports she stumbled and fell going up her stairs yesterday evening, landing on her anterior L knee, and then somehow twisting over to injure her R lower back. Complaining of moderate to severe aching pain to both areas, worse with movement.   Past Medical History  Diagnosis Date  . Diabetes mellitus without complication     controlled with diet  . Asthma   . Arthritis   . Depression   . Allergy   . Personal history of kidney stones   . Personal history of colonic polyps   . Refusal of blood transfusions as patient is Jehovah's Witness   . OSA (obstructive sleep apnea) 05/27/2013   Past Surgical History  Procedure Laterality Date  . Back surgery  1990, 2000, 2001, 2012    4 back surgeries, last on 2012, 8 screws and rods, spinal fusion  . Miscarrigae      x 3 with D&C  . Dilation and curettage of uterus      x 3  . Tonsillectomy    . Tonsillectomy  1960   Family History  Problem Relation Age of Onset  . Cancer Mother     breast  . Arthritis Mother   . Diabetes Father   . Cancer Maternal Aunt     ? type  . Cancer Maternal Uncle     pancreas  . Kidney disease Paternal Uncle   . Diabetes Paternal Grandmother    History  Substance Use Topics  . Smoking status: Never Smoker   . Smokeless tobacco: Never Used  . Alcohol Use: Yes     Comment: rarely   OB History   Grav Para Term Preterm Abortions TAB SAB Ect Mult Living                 Review of Systems All other systems reviewed and are negative except as noted in HPI.     Allergies  Other; Plum pulp; Shellfish-derived products; Theophyllines;  Darvocet; Influenza vaccines; and Ivp dye  Home Medications   Prior to Admission medications   Medication Sig Start Date End Date Taking? Authorizing Provider  MORPHINE SULFATE PO Take by mouth.   Yes Historical Provider, MD  albuterol (PROVENTIL HFA;VENTOLIN HFA) 108 (90 BASE) MCG/ACT inhaler Inhale 2 puffs into the lungs every 6 (six) hours as needed for wheezing. 05/27/13   Sandford Craze, NP  albuterol (PROVENTIL) (2.5 MG/3ML) 0.083% nebulizer solution Take 3 mLs (2.5 mg total) by nebulization every 6 (six) hours as needed for wheezing. 06/03/13   Sandford Craze, NP  AMBULATORY NON FORMULARY MEDICATION Nebulizer with tubing  Dx is asthma 06/03/13   Sandford Craze, NP  gabapentin (NEURONTIN) 300 MG capsule Take 300 mg by mouth 3 (three) times daily.    Historical Provider, MD  ibuprofen (ADVIL,MOTRIN) 800 MG tablet Take 1 tablet (800 mg total) by mouth 3 (three) times daily. 04/27/13   [name], MD  NON FORMULARY Monoxodil OTC    Historical Provider, MD  oxyCODONE (OXY IR/ROXICODONE) 5 MG immediate release tablet Take 20 mg by mouth every 6 (six) hours as needed for pain (break through pain).  Historical Provider, MD  OxyCODONE (OXYCONTIN) 15 mg T12A Take 15 mg by mouth 3 (three) times daily.    Historical Provider, MD  sertraline (ZOLOFT) 100 MG tablet Take 100 mg by mouth daily. Takes in pm    Historical Provider, MD   BP 112/67  Pulse 85  Temp(Src) 98.3 F (36.8 C) (Oral)  Resp 18  Ht 5' (1.524 m)  Wt 160 lb (72.576 kg)  BMI 31.25 kg/m2  SpO2 95% Physical Exam  Nursing note and vitals reviewed. Constitutional: She is oriented to person, place, and time. She appears well-developed and well-nourished.  HENT:  Head: Normocephalic and atraumatic.  Eyes: EOM are normal. Pupils are equal, round, and reactive to light.  Neck: Normal range of motion. Neck supple.  Cardiovascular: Normal rate, normal heart sounds and intact distal pulses.   Pulmonary/Chest: Effort  normal and breath sounds normal.  Abdominal: Bowel sounds are normal. She exhibits no distension. There is no tenderness.  Musculoskeletal: She exhibits edema and tenderness (L anterior knee, ecchymosis, no significant effusion; diffuse lower back, no external signs of injury).  Neurological: She is alert and oriented to person, place, and time. She has normal strength. No cranial nerve deficit or sensory deficit.  Skin: Skin is warm and dry. No rash noted.  Psychiatric: She has a normal mood and affect.    ED Course  Procedures (including critical care time) Labs Review Labs Reviewed - No data to display  Imaging Review Dg Lumbar Spine Complete  01/06/2014   CLINICAL DATA:  Fall, back pain.  EXAM: LUMBAR SPINE - COMPLETE 4+ VIEW  COMPARISON:  CT abdomen and pelvis 03/14/2013. Plain films lumbar spine 04/27/2013.  FINDINGS: The patient is status post L3-S1 fusion. Hardware is intact. Vertebral body height and alignment are maintained. Intervertebral disc space height is normal.  IMPRESSION: No acute finding. Status post L3-S1 fusion. Stable compared to prior exam.   Electronically Signed   By: Drusilla Kanner M.D.   On: 01/06/2014 17:19   Dg Knee Complete 4 Views Left  01/06/2014   CLINICAL DATA:  Fall, knee pain.  EXAM: LEFT KNEE - COMPLETE 4+ VIEW  COMPARISON:  None.  FINDINGS: Imaged bones, joints and soft tissues appear normal.  IMPRESSION: Negative exam.   Electronically Signed   By: Drusilla Kanner M.D.   On: 01/06/2014 17:21     EKG Interpretation None      MDM   Final diagnoses:  Fall  Contusion of left knee  Exacerbation of chronic back pain    Xrays neg, knee immobilizer, home pain meds if needed. PCP followup.     Charles B. Bernette Mayers, MD 01/06/14 1758

## 2014-06-10 ENCOUNTER — Emergency Department (HOSPITAL_COMMUNITY): Payer: Medicare Other

## 2014-06-10 ENCOUNTER — Emergency Department (HOSPITAL_COMMUNITY)
Admission: EM | Admit: 2014-06-10 | Discharge: 2014-06-10 | Disposition: A | Discharge status: Home / Self Care | Payer: Medicare Other | Attending: Emergency Medicine | Admitting: Emergency Medicine

## 2014-06-10 ENCOUNTER — Encounter (HOSPITAL_COMMUNITY): Payer: Self-pay | Admitting: Emergency Medicine

## 2014-06-10 DIAGNOSIS — E119 Type 2 diabetes mellitus without complications: Secondary | ICD-10-CM | POA: Diagnosis not present

## 2014-06-10 DIAGNOSIS — Z8669 Personal history of other diseases of the nervous system and sense organs: Secondary | ICD-10-CM | POA: Diagnosis not present

## 2014-06-10 DIAGNOSIS — F329 Major depressive disorder, single episode, unspecified: Secondary | ICD-10-CM | POA: Insufficient documentation

## 2014-06-10 DIAGNOSIS — R1011 Right upper quadrant pain: Secondary | ICD-10-CM | POA: Insufficient documentation

## 2014-06-10 DIAGNOSIS — J45909 Unspecified asthma, uncomplicated: Secondary | ICD-10-CM | POA: Diagnosis not present

## 2014-06-10 DIAGNOSIS — Z79899 Other long term (current) drug therapy: Secondary | ICD-10-CM | POA: Insufficient documentation

## 2014-06-10 DIAGNOSIS — Z87442 Personal history of urinary calculi: Secondary | ICD-10-CM | POA: Diagnosis not present

## 2014-06-10 DIAGNOSIS — F3289 Other specified depressive episodes: Secondary | ICD-10-CM | POA: Insufficient documentation

## 2014-06-10 DIAGNOSIS — Z860100 Personal history of colon polyps, unspecified: Secondary | ICD-10-CM | POA: Insufficient documentation

## 2014-06-10 DIAGNOSIS — Z8739 Personal history of other diseases of the musculoskeletal system and connective tissue: Secondary | ICD-10-CM | POA: Diagnosis not present

## 2014-06-10 LAB — CBC WITH DIFFERENTIAL/PLATELET
Basophils Absolute: 0 10*3/uL (ref 0.0–0.1)
Basophils Relative: 0 % (ref 0–1)
Eosinophils Absolute: 0.2 10*3/uL (ref 0.0–0.7)
Eosinophils Relative: 2 % (ref 0–5)
HCT: 40.5 % (ref 36.0–46.0)
Hemoglobin: 13.8 g/dL (ref 12.0–15.0)
Lymphocytes Relative: 29 % (ref 12–46)
Lymphs Abs: 1.8 10*3/uL (ref 0.7–4.0)
MCH: 27.8 pg (ref 26.0–34.0)
MCHC: 34.1 g/dL (ref 30.0–36.0)
MCV: 81.7 fL (ref 78.0–100.0)
Monocytes Absolute: 0.5 10*3/uL (ref 0.1–1.0)
Monocytes Relative: 7 % (ref 3–12)
Neutro Abs: 3.9 10*3/uL (ref 1.7–7.7)
Neutrophils Relative %: 62 % (ref 43–77)
Platelets: 187 10*3/uL (ref 150–400)
RBC: 4.96 MIL/uL (ref 3.87–5.11)
RDW: 12.4 % (ref 11.5–15.5)
WBC: 6.3 10*3/uL (ref 4.0–10.5)

## 2014-06-10 LAB — COMPREHENSIVE METABOLIC PANEL
ALT: 29 U/L (ref 0–35)
AST: 23 U/L (ref 0–37)
Albumin: 3.7 g/dL (ref 3.5–5.2)
Alkaline Phosphatase: 70 U/L (ref 39–117)
Anion gap: 14 (ref 5–15)
BUN: 15 mg/dL (ref 6–23)
CO2: 25 mEq/L (ref 19–32)
Calcium: 9 mg/dL (ref 8.4–10.5)
Chloride: 102 mEq/L (ref 96–112)
Creatinine, Ser: 0.7 mg/dL (ref 0.50–1.10)
GFR calc Af Amer: >90 mL/min (ref >90)
GFR calc non Af Amer: >90 mL/min (ref >90)
Glucose, Bld: 112 mg/dL — ABNORMAL HIGH (ref 70–99)
Potassium: 4.1 mEq/L (ref 3.7–5.3)
Sodium: 141 mEq/L (ref 137–147)
Total Bilirubin: 0.2 mg/dL — ABNORMAL LOW (ref 0.3–1.2)
Total Protein: 6.8 g/dL (ref 6.0–8.3)

## 2014-06-10 LAB — URINALYSIS, ROUTINE W REFLEX MICROSCOPIC
Bilirubin Urine: NEGATIVE
Glucose, UA: NEGATIVE mg/dL
Hgb urine dipstick: NEGATIVE
Ketones, ur: NEGATIVE mg/dL
Leukocytes, UA: NEGATIVE
Nitrite: NEGATIVE
Protein, ur: NEGATIVE mg/dL
Specific Gravity, Urine: 1.025 (ref 1.005–1.030)
Urobilinogen, UA: 0.2 mg/dL (ref 0.0–1.0)
pH: 5.5 (ref 5.0–8.0)

## 2014-06-10 LAB — LIPASE, BLOOD: Lipase: 13 U/L (ref 11–59)

## 2014-06-10 MED ORDER — MORPHINE SULFATE 4 MG/ML IJ SOLN
6.0000 mg | Freq: Once | INTRAMUSCULAR | Status: AC
  Administered 2014-06-10: 6 mg via INTRAVENOUS
  Filled 2014-06-10: qty 2

## 2014-06-10 MED ORDER — SODIUM CHLORIDE 0.9 % IV BOLUS (SEPSIS)
1000.0000 mL | Freq: Once | INTRAVENOUS | Status: AC
  Administered 2014-06-10: 1000 mL via INTRAVENOUS

## 2014-06-10 MED ORDER — ONDANSETRON HCL 4 MG/2ML IJ SOLN
4.0000 mg | Freq: Once | INTRAMUSCULAR | Status: AC
  Administered 2014-06-10: 4 mg via INTRAVENOUS
  Filled 2014-06-10: qty 2

## 2014-06-10 MED ORDER — KETOROLAC TROMETHAMINE 15 MG/ML IJ SOLN
15.0000 mg | Freq: Once | INTRAMUSCULAR | Status: AC
  Administered 2014-06-10: 15 mg via INTRAVENOUS
  Filled 2014-06-10: qty 1

## 2014-06-10 NOTE — ED Notes (Signed)
Per EMS: Pt c/o sudden onset RUQ abdominal pain radiating into back x 50 minutes. Pt worse with movement, no pain while lying still. Pt denies any twisting movement upon pain onset. Denies N/V/D. Last PO intake was lunch.

## 2014-06-10 NOTE — Discharge Instructions (Signed)

## 2014-06-10 NOTE — ED Notes (Signed)
Bed: WA01 Expected date:  Expected time:  Means of arrival:  Comments: Bed 1, EMS, 58 F, RUQ Pain

## 2014-06-11 ENCOUNTER — Telehealth: Payer: Self-pay | Admitting: Family

## 2014-06-11 NOTE — Telephone Encounter (Signed)
Please contact pt and arrange ED follow up.  

## 2014-06-12 MED ORDER — PROMETHAZINE HCL 25 MG PO TABS: 25.0000 mg | ORAL_TABLET | Freq: Three times a day (TID) | ORAL | Status: DC | PRN

## 2014-06-12 NOTE — Telephone Encounter (Signed)
Patient scheduled appointment for 06/16/14 but states that she is still very nauseous and would like phenergen called into [facility]

## 2014-06-12 NOTE — Telephone Encounter (Signed)
Notified pt. 

## 2014-06-15 NOTE — ED Provider Notes (Signed)
CSN: [id number]     Arrival date & time 06/10/14  1908 History   First MD Initiated Contact with Patient 06/10/14 1916     Chief Complaint  Patient presents with  . Abdominal Pain     (Consider location/radiation/quality/duration/timing/severity/associated sxs/prior Treatment) HPI  58 year old female with right upper quadrant pain. Symptom onset about one hour prior to arrival. Patient was sitting in a chair and leaned her right side extending up. She did this she noticed the main pain in her abdomen. Progressively worsening. Radiates straight through to her back. Worse with some movements. Better when lying still but does not completely resolve. Denies or vomiting. No history similar type pain. No fevers or chills. No urinary complaints.  Past Medical History  Diagnosis Date  . Diabetes mellitus without complication     controlled with diet  . Asthma   . Arthritis   . Depression   . Allergy   . Personal history of kidney stones   . Personal history of colonic polyps   . Refusal of blood transfusions as patient is Jehovah's Witness   . OSA (obstructive sleep apnea) 05/27/2013   Past Surgical History  Procedure Laterality Date  . Back surgery  1990, 2000, 2001, 2012    4 back surgeries, last on 2012, 8 screws and rods, spinal fusion  . Miscarrigae      x 3 with D&C  . Dilation and curettage of uterus      x 3  . Tonsillectomy    . Tonsillectomy  1960   Family History  Problem Relation Age of Onset  . Cancer Mother     breast  . Arthritis Mother   . Diabetes Father   . Cancer Maternal Aunt     ? type  . Cancer Maternal Uncle     pancreas  . Kidney disease Paternal Uncle   . Diabetes Paternal Grandmother    History  Substance Use Topics  . Smoking status: Never Smoker   . Smokeless tobacco: Never Used  . Alcohol Use: Yes     Comment: rarely   OB History   Grav Para Term Preterm Abortions TAB SAB Ect Mult Living                 Review of Systems  All  systems reviewed and negative, other than as noted in HPI.   Allergies  Other; Plum pulp; Shellfish-derived products; Theophyllines; Darvocet; Influenza vaccines; Ivp dye; and Wheat bran  Home Medications   Prior to Admission medications   Medication Sig Start Date End Date Taking? Authorizing Provider  albuterol (PROVENTIL HFA;VENTOLIN HFA) 108 (90 BASE) MCG/ACT inhaler Inhale 2 puffs into the lungs every 6 (six) hours as needed for wheezing. 05/27/13  Yes Sandford Craze, NP  albuterol (PROVENTIL) (2.5 MG/3ML) 0.083% nebulizer solution Take 3 mLs (2.5 mg total) by nebulization every 6 (six) hours as needed for wheezing. 06/03/13  Yes Sandford Craze, NP  gabapentin (NEURONTIN) 300 MG capsule Take 300 mg by mouth 3 (three) times daily.   Yes Historical Provider, MD  ibuprofen (ADVIL,MOTRIN) 800 MG tablet Take 800 mg by mouth every 8 (eight) hours as needed for moderate pain.   Yes Historical Provider, MD  milk thistle 175 MG tablet Take 175 mg by mouth every evening.   Yes Historical Provider, MD  OxyCODONE (OXYCONTIN) 20 mg T12A 12 hr tablet Take 20 mg by mouth every 12 (twelve) hours.   Yes Historical Provider, MD  Oxycodone HCl 20 MG TABS Take  20 mg by mouth every 6 (six) hours as needed (breakthrough pain).   Yes Historical Provider, MD  sertraline (ZOLOFT) 100 MG tablet Take 100 mg by mouth at bedtime. Takes in pm   Yes Historical Provider, MD  promethazine (PHENERGAN) 25 MG tablet Take 1 tablet (25 mg total) by mouth every 8 (eight) hours as needed for nausea or vomiting. 06/12/14   Sandford Craze, NP   BP 92/53  Pulse 92  Temp(Src) 98.1 F (36.7 C) (Oral)  Resp 16  Ht 5' (1.524 m)  Wt 160 lb (72.576 kg)  BMI 31.25 kg/m2  SpO2 98% Physical Exam  Nursing note and vitals reviewed. Constitutional: She appears well-developed and well-nourished. No distress.  HENT:  Head: Normocephalic and atraumatic.  Eyes: Conjunctivae are normal. Right eye exhibits no discharge. Left eye  exhibits no discharge.  Neck: Neck supple.  Cardiovascular: Normal rate, regular rhythm and normal heart sounds.  Exam reveals no gallop and no friction rub.   No murmur heard. Pulmonary/Chest: Effort normal and breath sounds normal. No respiratory distress.  Abdominal: Soft. She exhibits no distension. There is tenderness. There is no rebound and no guarding.  Tenderness in the right upper quadrant without rebound or guarding. No distention.  Genitourinary:  No CVA tenderness  Musculoskeletal: She exhibits no edema and no tenderness.  Neurological: She is alert.  Skin: Skin is warm and dry.  Psychiatric: She has a normal mood and affect. Her behavior is normal. Thought content normal.    ED Course  Procedures (including critical care time) Labs Review Labs Reviewed  COMPREHENSIVE METABOLIC PANEL - Abnormal; Notable for the following:    Glucose, Bld 112 (*)    Total Bilirubin 0.2 (*)    All other components within normal limits  CBC WITH DIFFERENTIAL  LIPASE, BLOOD  URINALYSIS, ROUTINE W REFLEX MICROSCOPIC    Imaging Review No results found.   EKG Interpretation None      MDM   Final diagnoses:  RUQ pain    58 year old female with right upper quadrant pain. Describe to me what seems like biliary colic. Ultrasound unremarkable with regards to this though. LFTs/lipase normal. No respiratory complaints. No urinary complaints. Symptoms not should be consistent with ureteral colic and no blood on urinalysis to suggest this either. Possible peptic ulcer disease? Possible chest wall strain symptoms beginning as she was bending over to her right side? Atypical for ACS. Nonpalpable abdominal aorta. Hemodynamically stable. May potentially be biliary dyskinesia. Low suspicion for emergent process. Plan symptomatic treatment at this point. If symptoms persist she may benefit from GI or possible surgical evaluation.    Raeford Razor, MD 06/15/14 [phone]

## 2014-06-16 ENCOUNTER — Ambulatory Visit: Payer: Medicare Other | Admitting: Family

## 2014-07-11 ENCOUNTER — Emergency Department (HOSPITAL_COMMUNITY)
Admission: EM | Admit: 2014-07-11 | Discharge: 2014-07-11 | Disposition: A | Discharge status: Home / Self Care | Payer: Medicare Other | Attending: Emergency Medicine | Admitting: Emergency Medicine

## 2014-07-11 ENCOUNTER — Encounter (HOSPITAL_COMMUNITY): Payer: Self-pay | Admitting: Emergency Medicine

## 2014-07-11 DIAGNOSIS — F329 Major depressive disorder, single episode, unspecified: Secondary | ICD-10-CM | POA: Diagnosis not present

## 2014-07-11 DIAGNOSIS — R1013 Epigastric pain: Secondary | ICD-10-CM | POA: Insufficient documentation

## 2014-07-11 DIAGNOSIS — M549 Dorsalgia, unspecified: Secondary | ICD-10-CM | POA: Insufficient documentation

## 2014-07-11 DIAGNOSIS — Z79899 Other long term (current) drug therapy: Secondary | ICD-10-CM | POA: Insufficient documentation

## 2014-07-11 DIAGNOSIS — J45909 Unspecified asthma, uncomplicated: Secondary | ICD-10-CM | POA: Diagnosis not present

## 2014-07-11 DIAGNOSIS — G8929 Other chronic pain: Secondary | ICD-10-CM | POA: Insufficient documentation

## 2014-07-11 DIAGNOSIS — R110 Nausea: Secondary | ICD-10-CM | POA: Insufficient documentation

## 2014-07-11 DIAGNOSIS — Z87442 Personal history of urinary calculi: Secondary | ICD-10-CM | POA: Diagnosis not present

## 2014-07-11 DIAGNOSIS — R1011 Right upper quadrant pain: Secondary | ICD-10-CM | POA: Insufficient documentation

## 2014-07-11 DIAGNOSIS — E119 Type 2 diabetes mellitus without complications: Secondary | ICD-10-CM | POA: Diagnosis not present

## 2014-07-11 DIAGNOSIS — Z86010 Personal history of colonic polyps: Secondary | ICD-10-CM | POA: Insufficient documentation

## 2014-07-11 DIAGNOSIS — R109 Unspecified abdominal pain: Secondary | ICD-10-CM | POA: Diagnosis present

## 2014-07-11 LAB — COMPREHENSIVE METABOLIC PANEL
ALT: 49 U/L — AB (ref 0–35)
AST: 38 U/L — AB (ref 0–37)
Albumin: 4.3 g/dL (ref 3.5–5.2)
Alkaline Phosphatase: 82 U/L (ref 39–117)
Anion gap: 15 (ref 5–15)
BUN: 14 mg/dL (ref 6–23)
CHLORIDE: 98 meq/L (ref 96–112)
CO2: 25 meq/L (ref 19–32)
Calcium: 9.8 mg/dL (ref 8.4–10.5)
Creatinine, Ser: 0.76 mg/dL (ref 0.50–1.10)
GFR calc Af Amer: >90 mL/min (ref >90)
Glucose, Bld: 156 mg/dL — ABNORMAL HIGH (ref 70–99)
Potassium: 4.3 mEq/L (ref 3.7–5.3)
Sodium: 138 mEq/L (ref 137–147)
Total Bilirubin: 0.3 mg/dL (ref 0.3–1.2)
Total Protein: 7.7 g/dL (ref 6.0–8.3)

## 2014-07-11 LAB — LIPASE, BLOOD: Lipase: 14 U/L (ref 11–59)

## 2014-07-11 LAB — CBC WITH DIFFERENTIAL/PLATELET
BASOS ABS: 0 10*3/uL (ref 0.0–0.1)
Basophils Relative: 0 % (ref 0–1)
EOS PCT: 3 % (ref 0–5)
Eosinophils Absolute: 0.3 10*3/uL (ref 0.0–0.7)
HCT: 43.8 % (ref 36.0–46.0)
Hemoglobin: 15.1 g/dL — ABNORMAL HIGH (ref 12.0–15.0)
LYMPHS PCT: 35 % (ref 12–46)
Lymphs Abs: 3.1 10*3/uL (ref 0.7–4.0)
MCH: 28.6 pg (ref 26.0–34.0)
MCHC: 34.5 g/dL (ref 30.0–36.0)
MCV: 83 fL (ref 78.0–100.0)
Monocytes Absolute: 0.7 10*3/uL (ref 0.1–1.0)
Monocytes Relative: 8 % (ref 3–12)
NEUTROS ABS: 4.8 10*3/uL (ref 1.7–7.7)
Neutrophils Relative %: 54 % (ref 43–77)
Platelets: 229 10*3/uL (ref 150–400)
RBC: 5.28 MIL/uL — ABNORMAL HIGH (ref 3.87–5.11)
RDW: 12.9 % (ref 11.5–15.5)
WBC: 8.8 10*3/uL (ref 4.0–10.5)

## 2014-07-11 LAB — URINALYSIS, ROUTINE W REFLEX MICROSCOPIC
Bilirubin Urine: NEGATIVE
GLUCOSE, UA: NEGATIVE mg/dL
Hgb urine dipstick: NEGATIVE
KETONES UR: NEGATIVE mg/dL
Nitrite: NEGATIVE
PH: 5 (ref 5.0–8.0)
Protein, ur: NEGATIVE mg/dL
SPECIFIC GRAVITY, URINE: 1.017 (ref 1.005–1.030)
Urobilinogen, UA: 0.2 mg/dL (ref 0.0–1.0)

## 2014-07-11 LAB — URINE MICROSCOPIC-ADD ON

## 2014-07-11 MED ORDER — PROMETHAZINE HCL 25 MG/ML IJ SOLN
12.5000 mg | Freq: Once | INTRAMUSCULAR | Status: AC
  Administered 2014-07-11: 12.5 mg via INTRAVENOUS
  Filled 2014-07-11: qty 1

## 2014-07-11 MED ORDER — ONDANSETRON HCL 4 MG/2ML IJ SOLN
4.0000 mg | Freq: Once | INTRAMUSCULAR | Status: DC
  Filled 2014-07-11: qty 2

## 2014-07-11 MED ORDER — HYDROMORPHONE HCL 1 MG/ML IJ SOLN
1.0000 mg | Freq: Once | INTRAMUSCULAR | Status: AC
  Administered 2014-07-11: 1 mg via INTRAVENOUS
  Filled 2014-07-11: qty 1

## 2014-07-11 NOTE — ED Provider Notes (Signed)
CSN: [id number]     Arrival date & time 07/11/14  6269 History   First MD Initiated Contact with Patient 07/11/14 0330     Chief Complaint  Patient presents with  . Abdominal Pain     (Consider location/radiation/quality/duration/timing/severity/associated sxs/prior Treatment) HPI Comments: Patient with on-going RUQ abdominal pain with worsening pain over the past 2 days. Pain is sharp, radiating to R back and R shoulder. She has nausea without vomiting. No fever, diarrhea, urinary symptoms.   Patient has been seen in the ED 05/2014 and had US demonstrating gallbladder polyp. She subsequently followed up with her GI physician in [facility]. Patient is unable to tell me exactly what was done except it being a nuclear medicine test (HIDA scan?) and an MRI. She has not met with her physician to find out results. She contacted her GI office and was told that she was going to need to have her gallbladder out and if she is in extreme pain to go to ED. She would rather have surgery in [place] rather than [place] due to proximity. Unfortunately we cannot access patient's records here.   Patient has history of chronic back pain for which she takes OxyContin 27m bid and Oxycodone 275mq6hr for breakthrough pain. These have not been helping with flares of the abdominal pain.   Patient is a [age].o. female presenting with abdominal pain. The history is provided by the patient and medical records.  Abdominal Pain Associated symptoms: nausea   Associated symptoms: no chest pain, no cough, no diarrhea, no dysuria, no fever, no shortness of breath, no sore throat and no vomiting     Past Medical History  Diagnosis Date  . Diabetes mellitus without complication     controlled with diet  . Asthma   . Arthritis   . Depression   . Allergy   . Personal history of kidney stones   . Personal history of colonic polyps   . Refusal of blood transfusions as patient is Jehovah's Witness   . OSA  (obstructive sleep apnea) 05/27/2013   Past Surgical History  Procedure Laterality Date  . Back surgery  1990, 2000, 2001, 2012    4 back surgeries, last on 2012, 8 screws and rods, spinal fusion  . Miscarrigae      x 3 with D&C  . Dilation and curettage of uterus      x 3  . Tonsillectomy    . Tonsillectomy  1960   Family History  Problem Relation Age of Onset  . Cancer Mother     breast  . Arthritis Mother   . Diabetes Father   . Cancer Maternal Aunt     ? type  . Cancer Maternal Uncle     pancreas  . Kidney disease Paternal Uncle   . Diabetes Paternal Grandmother    History  Substance Use Topics  . Smoking status: Never Smoker   . Smokeless tobacco: Never Used  . Alcohol Use: Yes     Comment: rarely   OB History   Grav Para Term Preterm Abortions TAB SAB Ect Mult Living                 Review of Systems  Constitutional: Negative for fever.  HENT: Negative for rhinorrhea and sore throat.   Eyes: Negative for redness.  Respiratory: Negative for cough and shortness of breath.   Cardiovascular: Negative for chest pain.  Gastrointestinal: Positive for nausea and abdominal pain. Negative for vomiting and  diarrhea.  Genitourinary: Negative for dysuria.  Musculoskeletal: Positive for back pain (chronic). Negative for myalgias.  Skin: Negative for rash.  Neurological: Negative for headaches.      Allergies  Other; Plum pulp; Shellfish-derived products; Theophyllines; Darvocet; Influenza vaccines; Ivp dye; and Wheat bran  Home Medications   Prior to Admission medications   Medication Sig Start Date End Date Taking? Authorizing Provider  albuterol (PROVENTIL HFA;VENTOLIN HFA) 108 (90 BASE) MCG/ACT inhaler Inhale 2 puffs into the lungs every 6 (six) hours as needed for wheezing. 05/27/13  Yes Debbrah Alar, NP  albuterol (PROVENTIL) (2.5 MG/3ML) 0.083% nebulizer solution Take 3 mLs (2.5 mg total) by nebulization every 6 (six) hours as needed for wheezing.  06/03/13  Yes Debbrah Alar, NP  Ascorbic Acid (VITAMIN C PO) Take 1 tablet by mouth daily.   Yes Historical Provider, MD  gabapentin (NEURONTIN) 300 MG capsule Take 300 mg by mouth 3 (three) times daily.   Yes Historical Provider, MD  LECITHIN PO Take 1 tablet by mouth daily.   Yes Historical Provider, MD  milk thistle 175 MG tablet Take 175 mg by mouth every evening.   Yes Historical Provider, MD  Misc Natural Products (DANDELION ROOT PO) Take 1 tablet by mouth daily.   Yes Historical Provider, MD  OxyCODONE (OXYCONTIN) 20 mg T12A 12 hr tablet Take 20 mg by mouth every 12 (twelve) hours.   Yes Historical Provider, MD  Oxycodone HCl 20 MG TABS Take 20 mg by mouth every 6 (six) hours as needed (breakthrough pain).   Yes Historical Provider, MD  promethazine (PHENERGAN) 25 MG tablet Take 1 tablet (25 mg total) by mouth every 8 (eight) hours as needed for nausea or vomiting. 06/12/14  Yes Debbrah Alar, NP  sertraline (ZOLOFT) 100 MG tablet Take 50 mg by mouth at bedtime. Takes in pm   Yes Historical Provider, MD  VITAMIN E PO Take 1 tablet by mouth daily.   Yes Historical Provider, MD   BP 133/75  Pulse 103  Temp(Src) 98 F (36.7 C) (Oral)  Resp 18  SpO2 97% Physical Exam  Nursing note and vitals reviewed. Constitutional: She appears well-developed and well-nourished. She appears distressed.  Patient is uncomfortable appearing.   HENT:  Head: Normocephalic and atraumatic.  Eyes: Conjunctivae are normal. Right eye exhibits no discharge. Left eye exhibits no discharge.  Neck: Normal range of motion. Neck supple.  Cardiovascular: Normal rate, regular rhythm and normal heart sounds.   Pulmonary/Chest: Effort normal and breath sounds normal. No respiratory distress. She has no wheezes. She has no rales.  Abdominal: Soft. She exhibits no distension. There is tenderness (moderate) in the right upper quadrant and epigastric area. There is no rigidity, no rebound, no guarding, no tenderness  at McBurney's point and negative Murphy's sign.  Musculoskeletal: She exhibits no edema and no tenderness.  Neurological: She is alert.  Skin: Skin is warm and dry.  Psychiatric: She has a normal mood and affect.    ED Course  Procedures (including critical care time) Labs Review Labs Reviewed  CBC WITH DIFFERENTIAL - Abnormal; Notable for the following:    RBC 5.28 (*)    Hemoglobin 15.1 (*)    All other components within normal limits  COMPREHENSIVE METABOLIC PANEL - Abnormal; Notable for the following:    Glucose, Bld 156 (*)    AST 38 (*)    ALT 49 (*)    All other components within normal limits  LIPASE, BLOOD  URINALYSIS, ROUTINE W REFLEX MICROSCOPIC  Imaging Review No results found.   EKG Interpretation None      Patient seen and examined. Work-up initiated. Medications ordered.   Vital signs reviewed and are as follows: BP 133/75  Pulse 103  Temp(Src) 98 F (36.7 C) (Oral)  Resp 18  SpO2 97%  5:34 AM Patient's pain is currently controlled if she does not move or push on RUQ. [facility] does not open until 7am. Will attempt to obtain records at that time. Will monitor.   Will sign-out to oncoming PA at shift change.   Plan: continue pain control. Will need to obtain records to clarify work-up and findings. If abnormal HIDA or MR exists and patient has intractable pain -- she may need surgical consult. I do not think we have any ability to increase patient's chronic pain medications to help control pain at this point.    MDM   Final diagnoses:  Right upper quadrant abdominal pain   Pending records and completion of treatment.     Carlisle Cater, PA-C 07/11/14 [phone]

## 2014-07-11 NOTE — ED Provider Notes (Signed)
Patient signed out to my by Emmit Alexanders, PA-C at change of shift. Patient with abdominal pain x months, worse over the past week. Pain is gripping in RUQ. Told by her gastroenterologist Dr. Karena Addison to come to ED if pain worsens to have her gallbladder taken out. Records pending.   Records show no abnormality in gallbladder. MRI shows "no intrahepatic or extrahepatic ductal dilatation. Common duct measures 6mm, within the upper limits of normal. No choledocholithiasis is seen.   HIDA scan shows gallbladder ejection is within normal limits at 81%. The cystic duct and CBD are patent. Normal hepatocytes are uptake of radiopharmaceutical.   9:48 AM Discussed case with Provider Thayer Ohm at [facility] who has seen patient in the past. He agrees, no reason for emergent surgery consult. We reviewed patient's history, imaging, and procedures together over phone.   Patient is reevaluated prior to discharge. TTP in RUQ. No rebound or guarding. Discussed that there is nothing in labs or imaging which requires emergent surgical consultation. She was encouraged to follow up with her gastroenterologist and [place] [facility] as an outpatient. Patient unhappy we are unable to schedule her for emergent cholecystectomy today. Discussed reasons to return to ED. Vital signs stable for discharge. Discussed case with Dr. Ethelda Chick who agrees with plan.   Mora Bellman, PA-C 07/11/14 [address] [place], [place] 07/11/14 1030

## 2014-07-11 NOTE — ED Provider Notes (Signed)
Medical screening examination/treatment/procedure(s) were performed by non-physician practitioner and as supervising physician I was immediately available for consultation/collaboration.   EKG Interpretation None       Doug Sou, MD 07/11/14 1615

## 2014-07-11 NOTE — ED Provider Notes (Signed)
Medical screening examination/treatment/procedure(s) were performed by non-physician practitioner and as supervising physician I was immediately available for consultation/collaboration.   EKG Interpretation None        Makayla Seamen, MD 07/11/14 0730

## 2014-07-11 NOTE — ED Notes (Signed)
PT states that she has been diagnosed with a polyp in her gallbladder; pt states that she has an appointment with the surgeon on 11/19; pt states that the pain is progressing and was advised to come to the ER if pain persisted

## 2014-07-11 NOTE — Discharge Instructions (Signed)
Abdominal Pain  Many things can cause abdominal pain. Usually, abdominal pain is not caused by a disease and will improve without treatment. It can often be observed and treated at home. Your health care provider will do a physical exam and possibly order blood tests and X-rays to help determine the seriousness of your pain. However, in many cases, more time must pass before a clear cause of the pain can be found. Before that point, your health care provider may not know if you need more testing or further treatment.  HOME CARE INSTRUCTIONS   Monitor your abdominal pain for any changes. The following actions may help to alleviate any discomfort you are experiencing:   Only take over-the-counter or prescription medicines as directed by your health care provider.   Do not take laxatives unless directed to do so by your health care provider.   Try a clear liquid diet (broth, tea, or water) as directed by your health care provider. Slowly move to a bland diet as tolerated.  SEEK MEDICAL CARE IF:   You have unexplained abdominal pain.   You have abdominal pain associated with nausea or diarrhea.   You have pain when you urinate or have a bowel movement.   You experience abdominal pain that wakes you in the night.   You have abdominal pain that is worsened or improved by eating food.   You have abdominal pain that is worsened with eating fatty foods.   You have a fever.  SEEK IMMEDIATE MEDICAL CARE IF:    Your pain does not go away within 2 hours.   You keep throwing up (vomiting).   Your pain is felt only in portions of the abdomen, such as the right side or the left lower portion of the abdomen.   You pass bloody or black tarry stools.  MAKE SURE YOU:   Understand these instructions.    Will watch your condition.    Will get help right away if you are not doing well or get worse.   Document Released: 06/11/2005 Document Revised: 09/06/2013 Document Reviewed: 05/11/2013  Exit[employer] Patient Information  2015 [employer]. This information is not intended to replace advice given to you by your health care provider. Make sure you discuss any questions you have with your health care provider.          Biliary Colic   Biliary colic is a steady or irregular pain in the upper abdomen. It is usually under the right side of the rib cage. It happens when gallstones interfere with the normal flow of bile from the gallbladder. Bile is a liquid that helps to digest fats. Bile is made in the liver and stored in the gallbladder. When you eat a meal, bile passes from the gallbladder through the cystic duct and the common bile duct into the small intestine. There, it mixes with partially digested food. If a gallstone blocks either of these ducts, the normal flow of bile is blocked. The muscle cells in the bile duct contract forcefully to try to move the stone. This causes the pain of biliary colic.   SYMPTOMS    A person with biliary colic usually complains of pain in the upper abdomen. This pain can be:   In the center of the upper abdomen just below the breastbone.   In the upper-right part of the abdomen, near the gallbladder and liver.   Spread back toward the right shoulder blade.   Nausea and vomiting.   The pain   usually occurs after eating.   Biliary colic is usually triggered by the digestive system's demand for bile. The demand for bile is high after fatty meals. Symptoms can also occur when a person who has been fasting suddenly eats a very large meal. Most episodes of biliary colic pass after 1 to 5 hours. After the most intense pain passes, your abdomen may continue to ache mildly for about 24 hours.  DIAGNOSIS   After you describe your symptoms, your caregiver will perform a physical exam. He or she will pay attention to the upper right portion of your belly (abdomen). This is the area of your liver and gallbladder. An ultrasound will help your caregiver look for gallstones. Specialized scans of the  gallbladder may also be done. Blood tests may be done, especially if you have fever or if your pain persists.  PREVENTION   Biliary colic can be prevented by controlling the risk factors for gallstones. Some of these risk factors, such as heredity, increasing age, and pregnancy are a normal part of life. Obesity and a high-fat diet are risk factors you can change through a healthy lifestyle. Women going through menopause who take hormone replacement therapy (estrogen) are also more likely to develop biliary colic.  TREATMENT    Pain medication may be prescribed.   You may be encouraged to eat a fat-free diet.   If the first episode of biliary colic is severe, or episodes of colic keep retuning, surgery to remove the gallbladder (cholecystectomy) is usually recommended. This procedure can be done through small incisions using an instrument called a laparoscope. The procedure often requires a brief stay in the hospital. Some people can leave the hospital the same day. It is the most widely used treatment in people troubled by painful gallstones. It is effective and safe, with no complications in more than 90% of cases.   If surgery cannot be done, medication that dissolves gallstones may be used. This medication is expensive and can take months or years to work. Only small stones will dissolve.   Rarely, medication to dissolve gallstones is combined with a procedure called shock-wave lithotripsy. This procedure uses carefully aimed shock waves to break up gallstones. In many people treated with this procedure, gallstones form again within a few years.  PROGNOSIS   If gallstones block your cystic duct or common bile duct, you are at risk for repeated episodes of biliary colic. There is also a 25% chance that you will develop a gallbladder infection(acute cholecystitis), or some other complication of gallstones within 10 to 20 years. If you have surgery, schedule it at a time that is convenient for you and at a  time when you are not sick.  HOME CARE INSTRUCTIONS    Drink plenty of clear fluids.   Avoid fatty, greasy or fried foods, or any foods that make your pain worse.   Take medications as directed.  SEEK MEDICAL CARE IF:    You develop a fever over 100.5 F (38.1 C).   Your pain gets worse over time.   You develop nausea that prevents you from eating and drinking.   You develop vomiting.  SEEK IMMEDIATE MEDICAL CARE IF:    You have continuous or severe belly (abdominal) pain which is not relieved with medications.   You develop nausea and vomiting which is not relieved with medications.   You have symptoms of biliary colic and you suddenly develop a fever and shaking chills. This may signal cholecystitis. Call your caregiver   immediately.   You develop a yellow color to your skin or the white part of your eyes (jaundice).  Document Released: 02/02/2006 Document Revised: 11/24/2011 Document Reviewed: 04/13/2008  Exit[employer] Patient Information 2015 [employer]. This information is not intended to replace advice given to you by your health care provider. Make sure you discuss any questions you have with your health care provider.

## 2014-09-05 ENCOUNTER — Telehealth: Payer: Self-pay | Admitting: *Deleted

## 2014-09-05 MED ORDER — ALBUTEROL SULFATE HFA 108 (90 BASE) MCG/ACT IN AERS: 2.0000 | INHALATION_SPRAY | Freq: Four times a day (QID) | RESPIRATORY_TRACT | Status: AC | PRN

## 2014-09-05 NOTE — Telephone Encounter (Signed)
Received refill request from [facility] for ventolin HFA.  Refill sent. Pt is past due for fasting physical.  Please call pt to arrange CPE.

## 2014-09-20 ENCOUNTER — Encounter: Payer: Self-pay | Admitting: *Deleted

## 2014-09-20 NOTE — Telephone Encounter (Signed)
Letter mailed to patient.

## 2015-07-10 IMAGING — CR DG LUMBAR SPINE COMPLETE 4+V
5 series · 5 of 5 positions shown · non-contrast
Comparison: CT abdomen and pelvis 03/14/2013. Plain films lumbar
spine 04/27/2013.

CLINICAL DATA: Fall, back pain.

EXAM:
LUMBAR SPINE - COMPLETE 4+ VIEW

[t l-spine a.p.]
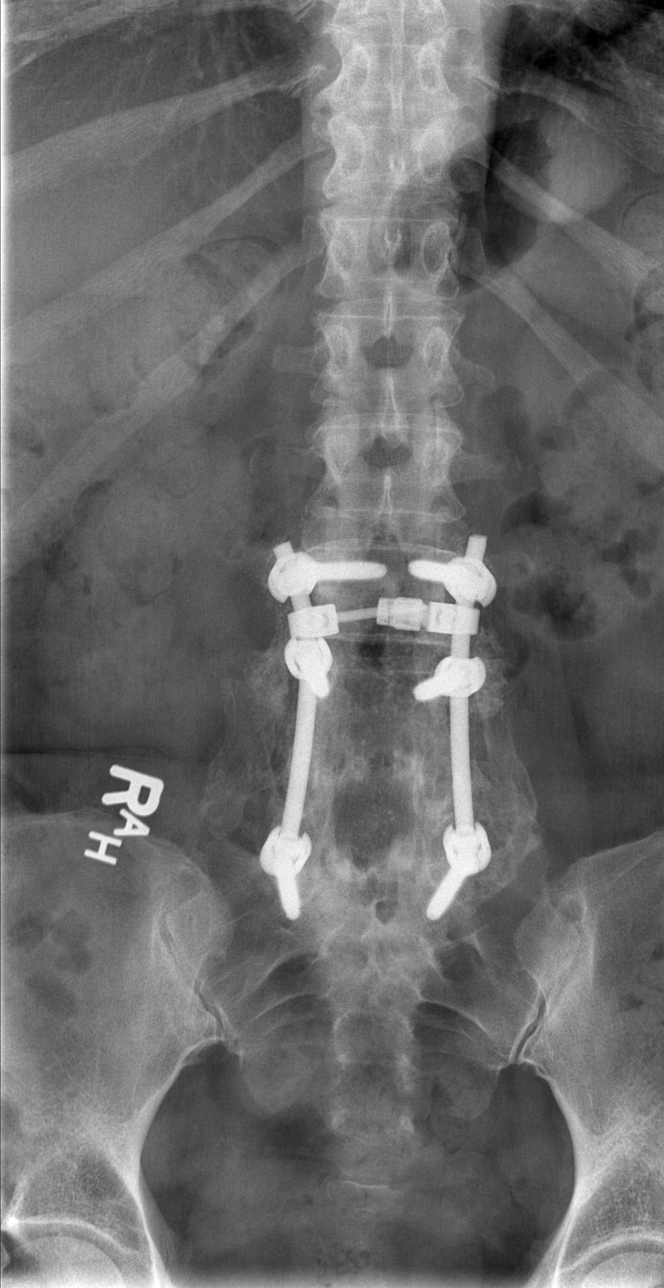

[t l-spine oblique exposure (1 of 2)]
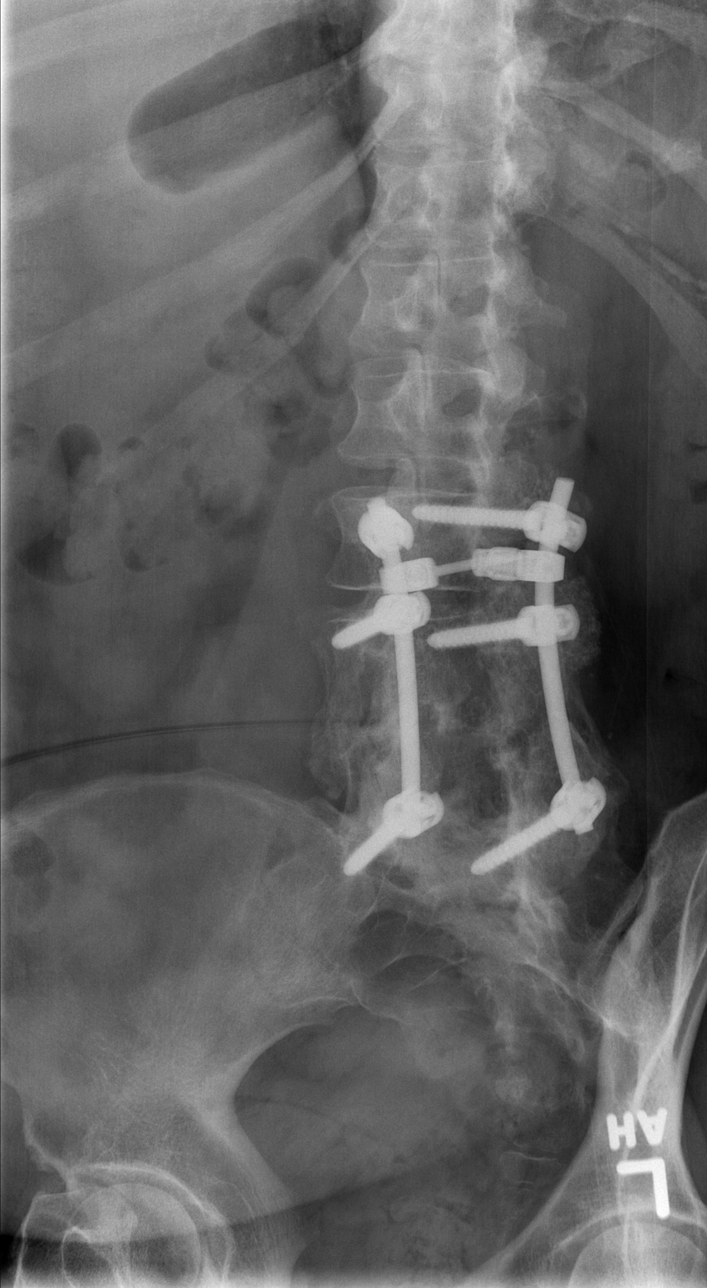

[t l-spine oblique exposure (2 of 2)]
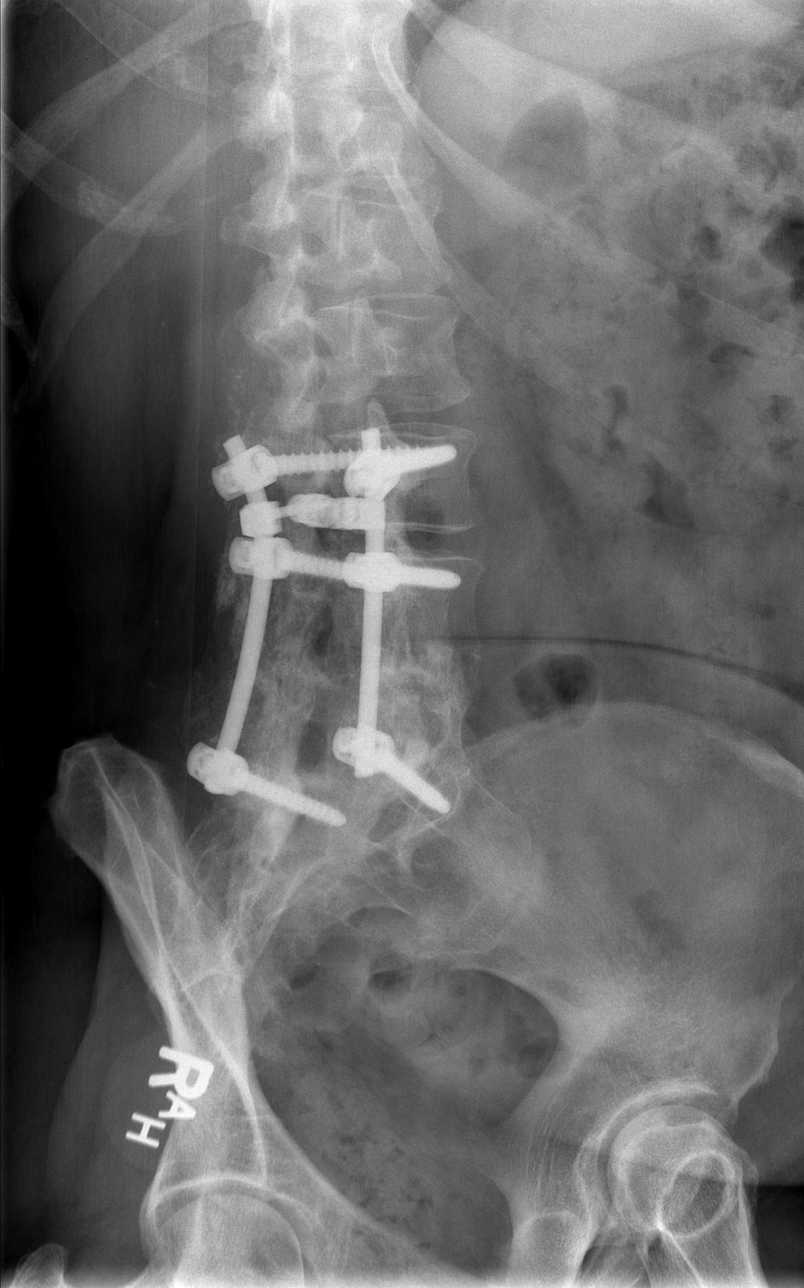

[t l-spine lat]
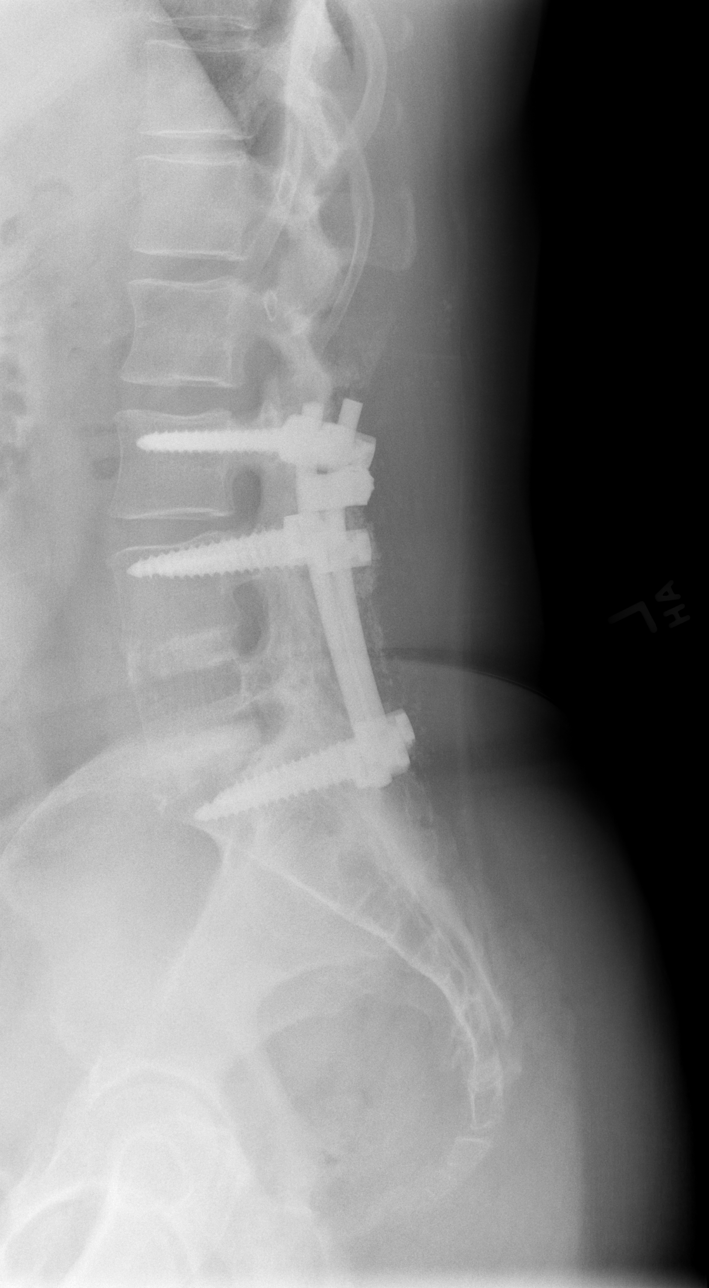

[t l-spine l5-s1 spot]
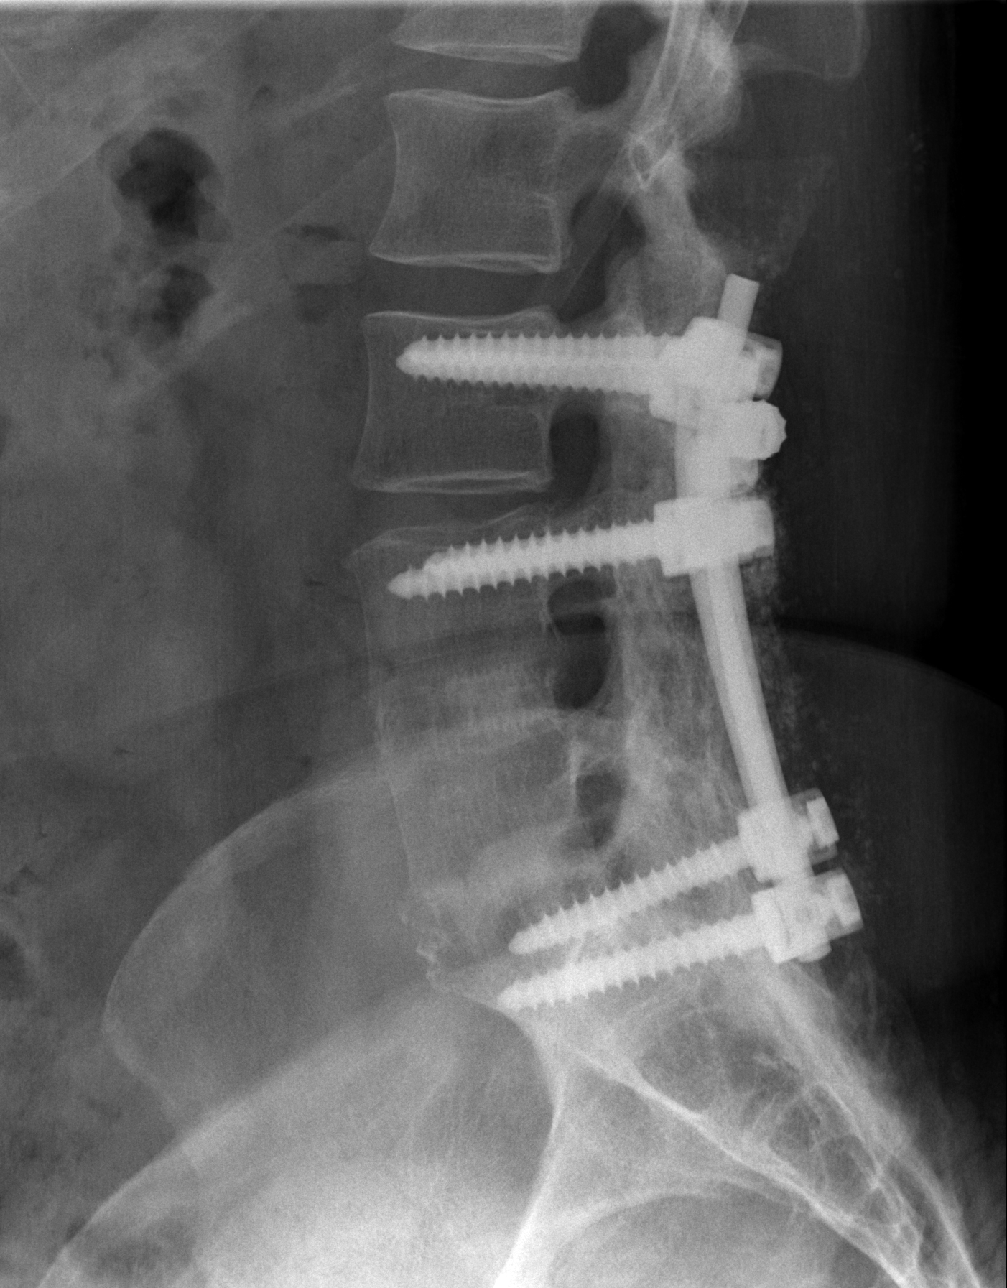

[5 of 5 positions shown; findings below may reference images not displayed]

FINDINGS: The patient is status post L3-S1 fusion. Hardware is intact.
Vertebral body height and alignment are maintained. Intervertebral
disc space height is normal.
IMPRESSION: No acute finding. Status post L3-S1 fusion. Stable compared to prior
exam.

## 2019-07-08 ENCOUNTER — Encounter (INDEPENDENT_AMBULATORY_CARE_PROVIDER_SITE_OTHER): Payer: Self-pay

## 2019-08-05 ENCOUNTER — Encounter (INDEPENDENT_AMBULATORY_CARE_PROVIDER_SITE_OTHER): Payer: Self-pay | Admitting: Otolaryngology

## 2019-08-05 ENCOUNTER — Other Ambulatory Visit: Payer: Self-pay

## 2019-08-05 ENCOUNTER — Ambulatory Visit (INDEPENDENT_AMBULATORY_CARE_PROVIDER_SITE_OTHER): Payer: Medicare Other | Admitting: Otolaryngology

## 2019-08-05 VITALS — Temp 96.6°F

## 2019-08-05 DIAGNOSIS — H8111 Benign paroxysmal vertigo, right ear: Secondary | ICD-10-CM | POA: Diagnosis not present

## 2019-08-05 DIAGNOSIS — H60311 Diffuse otitis externa, right ear: Secondary | ICD-10-CM | POA: Diagnosis not present

## 2019-08-05 NOTE — Progress Notes (Signed)
HPI: Makayla Olson is a 63 y.o. female who returns today for evaluation of right ear problems.  She has been having some itching in the ear and more recently has had blockage and pain in the right ear that has been bad over the last 2 days.  She also has history of positional vertigo when she lies back and turns her head to the right. No headache. No sinus symptoms..  Past Medical History:  Diagnosis Date  . Allergy   . Arthritis   . Asthma   . Depression   . Diabetes mellitus without complication (HCC)    controlled with diet  . OSA (obstructive sleep apnea) 05/27/2013  . Personal history of colonic polyps   . Personal history of kidney stones   . Refusal of blood transfusions as patient is Jehovah's Witness    Past Surgical History:  Procedure Laterality Date  . BACK SURGERY  1990, 2000, 2001, 2012   4 back surgeries, last on 2012, 8 screws and rods, spinal fusion  . DILATION AND CURETTAGE OF UTERUS     x 3  . miscarrigae     x 3 with D&C  . TONSILLECTOMY    . TONSILLECTOMY  1960   Social History   Socioeconomic History  . Marital status: Single    Spouse name: Not on file  . Number of children: Not on file  . Years of education: Not on file  . Highest education level: Not on file  Occupational History  . Not on file  Social Needs  . Financial resource strain: Not on file  . Food insecurity    Worry: Not on file    Inability: Not on file  . Transportation needs    Medical: Not on file    Non-medical: Not on file  Tobacco Use  . Smoking status: Never Smoker  . Smokeless tobacco: Never Used  Substance and Sexual Activity  . Alcohol use: Yes    Comment: rarely  . Drug use: No  . Sexual activity: Never  Lifestyle  . Physical activity    Days per week: Not on file    Minutes per session: Not on file  . Stress: Not on file  Relationships  . Social Musician on phone: Not on file    Gets together: Not on file    Attends religious service: Not on  file    Active member of club or organization: Not on file    Attends meetings of clubs or organizations: Not on file    Relationship status: Not on file  Other Topics Concern  . Not on file  Social History Narrative   Divorced   2 grown children- daughter lives locally, son lives near [address].   Enjoys painting, sewing, reading, puzzles   Completed some college.   Lives alone.   Family History  Problem Relation Age of Onset  . Cancer Mother        breast  . Arthritis Mother   . Diabetes Father   . Cancer Maternal Aunt        ? type  . Cancer Maternal Uncle        pancreas  . Kidney disease Paternal Uncle   . Diabetes Paternal Grandmother    Allergies  Allergen Reactions  . Other Anaphylaxis    [place] nuts, ?filaberts and hazelnuts  . Plum Pulp Anaphylaxis  . Shellfish-Derived Products Anaphylaxis  . Theophyllines Shortness Of Breath and Diarrhea  . Darvocet [  Propoxyphene N-Acetaminophen] Other (See Comments)    halluciations  . Influenza Vaccines   . Ivp Dye [Iodinated Diagnostic Agents]   . Wheat Bran    Prior to Admission medications   Medication Sig Start Date End Date Taking? Authorizing Provider  albuterol (PROVENTIL HFA;VENTOLIN HFA) 108 (90 BASE) MCG/ACT inhaler Inhale 2 puffs into the lungs every 6 (six) hours as needed for wheezing. 09/05/14  Yes Debbrah Alar, NP  albuterol (PROVENTIL) (2.5 MG/3ML) 0.083% nebulizer solution Take 3 mLs (2.5 mg total) by nebulization every 6 (six) hours as needed for wheezing. 06/03/13  Yes Debbrah Alar, NP  Ascorbic Acid (VITAMIN C PO) Take 1 tablet by mouth daily.   Yes [provider]  gabapentin (NEURONTIN) 300 MG capsule Take 300 mg by mouth 3 (three) times daily.   Yes [provider]  LECITHIN PO Take 1 tablet by mouth daily.   Yes [provider]  milk thistle 175 MG tablet Take 175 mg by mouth every evening.   Yes [provider]  Misc Natural Products (DANDELION ROOT  PO) Take 1 tablet by mouth daily.   Yes [provider]  OxyCODONE (OXYCONTIN) 20 mg T12A 12 hr tablet Take 20 mg by mouth every 12 (twelve) hours.   Yes [provider]  Oxycodone HCl 20 MG TABS Take 20 mg by mouth every 6 (six) hours as needed (breakthrough pain).   Yes [provider]  promethazine (PHENERGAN) 25 MG tablet Take 1 tablet (25 mg total) by mouth every 8 (eight) hours as needed for nausea or vomiting. 06/12/14  Yes Debbrah Alar, NP  sertraline (ZOLOFT) 100 MG tablet Take 50 mg by mouth at bedtime. Takes in pm   Yes [provider]  VITAMIN E PO Take 1 tablet by mouth daily.   Yes [provider]     Positive ROS: Otherwise negative  All other systems have been reviewed and were otherwise negative with the exception of those mentioned in the HPI and as above.  Physical Exam: Constitutional: Alert, well-appearing, no acute distress Ears: Left ear canal and TM are clear.  Right ear reveals swelling erythema consistent with external otitis along with debris within the ear canal that was cleaned in the office using suction.  The TM was intact and clear.  I applied gentian violet Ciprodex and CSF powder to the right ear. Nasal: External nose without lesions. Septum relatively midline. Clear nasal passages Oral: Lips and gums without lesions. Tongue and palate mucosa without lesions. Posterior oropharynx clear. Neck: No palpable adenopathy or masses Respiratory: Breathing comfortably  Skin: No facial/neck lesions or rash noted.  Cerumen impaction removal  Date/Time: 08/05/2019 3:42 PM Performed by: Rozetta Nunnery, MD Authorized by: Rozetta Nunnery, MD   Consent:    Consent obtained:  Verbal   Consent given by:  Patient   Risks discussed:  Pain and bleeding Procedure details:    Location:  R ear   Procedure type: suction   Post-procedure details:    Inspection:  TM intact (Right external otitis)   Hearing  quality:  Improved   Patient tolerance of procedure:  Tolerated well, no immediate complications Comments:     After cleaning the right ear canal I applied gentian violet, CSF powder and Ciprodex.    Assessment: Right external otitis Benign positional vertigo  Plan: Prescribed Cortisporin otic suspension 4 to 5 drops 3 times daily for 5 days.  She will follow-up as needed.   Radene Journey, MD

## 2019-11-24 DIAGNOSIS — M961 Postlaminectomy syndrome, not elsewhere classified: Secondary | ICD-10-CM | POA: Diagnosis not present

## 2019-11-24 DIAGNOSIS — Z79891 Long term (current) use of opiate analgesic: Secondary | ICD-10-CM | POA: Diagnosis not present

## 2019-11-24 DIAGNOSIS — G894 Chronic pain syndrome: Secondary | ICD-10-CM | POA: Diagnosis not present

## 2019-11-24 DIAGNOSIS — M47816 Spondylosis without myelopathy or radiculopathy, lumbar region: Secondary | ICD-10-CM | POA: Diagnosis not present

## 2020-01-10 DIAGNOSIS — G4733 Obstructive sleep apnea (adult) (pediatric): Secondary | ICD-10-CM | POA: Diagnosis not present

## 2020-01-10 DIAGNOSIS — J4520 Mild intermittent asthma, uncomplicated: Secondary | ICD-10-CM | POA: Diagnosis not present

## 2020-01-16 DIAGNOSIS — G894 Chronic pain syndrome: Secondary | ICD-10-CM | POA: Diagnosis not present

## 2020-01-16 DIAGNOSIS — M961 Postlaminectomy syndrome, not elsewhere classified: Secondary | ICD-10-CM | POA: Diagnosis not present

## 2020-01-16 DIAGNOSIS — M47816 Spondylosis without myelopathy or radiculopathy, lumbar region: Secondary | ICD-10-CM | POA: Diagnosis not present

## 2020-01-16 DIAGNOSIS — Z79891 Long term (current) use of opiate analgesic: Secondary | ICD-10-CM | POA: Diagnosis not present

## 2020-02-22 DIAGNOSIS — M961 Postlaminectomy syndrome, not elsewhere classified: Secondary | ICD-10-CM | POA: Diagnosis not present

## 2020-02-22 DIAGNOSIS — M47816 Spondylosis without myelopathy or radiculopathy, lumbar region: Secondary | ICD-10-CM | POA: Diagnosis not present

## 2020-02-22 DIAGNOSIS — Z79891 Long term (current) use of opiate analgesic: Secondary | ICD-10-CM | POA: Diagnosis not present

## 2020-02-22 DIAGNOSIS — G894 Chronic pain syndrome: Secondary | ICD-10-CM | POA: Diagnosis not present

## 2020-03-12 DIAGNOSIS — M961 Postlaminectomy syndrome, not elsewhere classified: Secondary | ICD-10-CM | POA: Diagnosis not present

## 2020-03-12 DIAGNOSIS — M47816 Spondylosis without myelopathy or radiculopathy, lumbar region: Secondary | ICD-10-CM | POA: Diagnosis not present

## 2020-03-12 DIAGNOSIS — G894 Chronic pain syndrome: Secondary | ICD-10-CM | POA: Diagnosis not present

## 2020-03-12 DIAGNOSIS — Z79891 Long term (current) use of opiate analgesic: Secondary | ICD-10-CM | POA: Diagnosis not present

## 2020-04-09 DIAGNOSIS — G894 Chronic pain syndrome: Secondary | ICD-10-CM | POA: Diagnosis not present

## 2020-04-09 DIAGNOSIS — Z79891 Long term (current) use of opiate analgesic: Secondary | ICD-10-CM | POA: Diagnosis not present

## 2020-04-09 DIAGNOSIS — M47816 Spondylosis without myelopathy or radiculopathy, lumbar region: Secondary | ICD-10-CM | POA: Diagnosis not present

## 2020-04-09 DIAGNOSIS — M961 Postlaminectomy syndrome, not elsewhere classified: Secondary | ICD-10-CM | POA: Diagnosis not present

## 2020-05-09 DIAGNOSIS — M961 Postlaminectomy syndrome, not elsewhere classified: Secondary | ICD-10-CM | POA: Diagnosis not present

## 2020-05-09 DIAGNOSIS — Z79891 Long term (current) use of opiate analgesic: Secondary | ICD-10-CM | POA: Diagnosis not present

## 2020-05-09 DIAGNOSIS — G894 Chronic pain syndrome: Secondary | ICD-10-CM | POA: Diagnosis not present

## 2020-05-09 DIAGNOSIS — M47816 Spondylosis without myelopathy or radiculopathy, lumbar region: Secondary | ICD-10-CM | POA: Diagnosis not present

## 2020-06-06 DIAGNOSIS — M47816 Spondylosis without myelopathy or radiculopathy, lumbar region: Secondary | ICD-10-CM | POA: Diagnosis not present

## 2020-06-06 DIAGNOSIS — M961 Postlaminectomy syndrome, not elsewhere classified: Secondary | ICD-10-CM | POA: Diagnosis not present

## 2020-06-06 DIAGNOSIS — Z79891 Long term (current) use of opiate analgesic: Secondary | ICD-10-CM | POA: Diagnosis not present

## 2020-06-06 DIAGNOSIS — G894 Chronic pain syndrome: Secondary | ICD-10-CM | POA: Diagnosis not present

## 2020-08-02 DIAGNOSIS — Z79891 Long term (current) use of opiate analgesic: Secondary | ICD-10-CM | POA: Diagnosis not present

## 2020-08-02 DIAGNOSIS — M47816 Spondylosis without myelopathy or radiculopathy, lumbar region: Secondary | ICD-10-CM | POA: Diagnosis not present

## 2020-08-02 DIAGNOSIS — M961 Postlaminectomy syndrome, not elsewhere classified: Secondary | ICD-10-CM | POA: Diagnosis not present

## 2020-08-02 DIAGNOSIS — G894 Chronic pain syndrome: Secondary | ICD-10-CM | POA: Diagnosis not present

## 2020-10-29 DIAGNOSIS — M961 Postlaminectomy syndrome, not elsewhere classified: Secondary | ICD-10-CM | POA: Diagnosis not present

## 2020-10-29 DIAGNOSIS — M47816 Spondylosis without myelopathy or radiculopathy, lumbar region: Secondary | ICD-10-CM | POA: Diagnosis not present

## 2020-10-29 DIAGNOSIS — G894 Chronic pain syndrome: Secondary | ICD-10-CM | POA: Diagnosis not present

## 2020-10-29 DIAGNOSIS — Z79891 Long term (current) use of opiate analgesic: Secondary | ICD-10-CM | POA: Diagnosis not present

## 2020-11-08 ENCOUNTER — Ambulatory Visit (INDEPENDENT_AMBULATORY_CARE_PROVIDER_SITE_OTHER): Payer: Medicare Other | Admitting: Otolaryngology

## 2020-11-08 ENCOUNTER — Other Ambulatory Visit: Payer: Self-pay

## 2020-11-08 ENCOUNTER — Encounter (INDEPENDENT_AMBULATORY_CARE_PROVIDER_SITE_OTHER): Payer: Self-pay | Admitting: Otolaryngology

## 2020-11-08 VITALS — Temp 96.4°F

## 2020-11-08 DIAGNOSIS — H6121 Impacted cerumen, right ear: Secondary | ICD-10-CM | POA: Diagnosis not present

## 2020-11-08 DIAGNOSIS — H60311 Diffuse otitis externa, right ear: Secondary | ICD-10-CM | POA: Diagnosis not present

## 2020-11-08 NOTE — Progress Notes (Signed)
HPI: Makayla Olson is a 65 y.o. female who returns today for evaluation of right ear pain that developed over the past week.  She has had some swelling of the ear canal but states that she has no eardrops to use.  She was last seen in November with similar problems.  She does sleep on this year.. She also complains of chronic itching more in the right ear.  Past Medical History:  Diagnosis Date  . Allergy   . Arthritis   . Asthma   . Depression   . Diabetes mellitus without complication (HCC)    controlled with diet  . OSA (obstructive sleep apnea) 05/27/2013  . Personal history of colonic polyps   . Personal history of kidney stones   . Refusal of blood transfusions as patient is Jehovah's Witness    Past Surgical History:  Procedure Laterality Date  . BACK SURGERY  1990, 2000, 2001, 2012   4 back surgeries, last on 2012, 8 screws and rods, spinal fusion  . DILATION AND CURETTAGE OF UTERUS     x 3  . miscarrigae     x 3 with D&C  . TONSILLECTOMY    . TONSILLECTOMY  1960   Social History   Socioeconomic History  . Marital status: Single    Spouse name: Not on file  . Number of children: Not on file  . Years of education: Not on file  . Highest education level: Not on file  Occupational History  . Not on file  Tobacco Use  . Smoking status: Never Smoker  . Smokeless tobacco: Never Used  Substance and Sexual Activity  . Alcohol use: Yes    Comment: rarely  . Drug use: No  . Sexual activity: Never  Other Topics Concern  . Not on file  Social History Narrative   Divorced   2 grown children- daughter lives locally, son lives near [address].   Enjoys painting, sewing, reading, puzzles   Completed some college.   Lives alone.   Social Determinants of Health   Financial Resource Strain: Not on file  Food Insecurity: Not on file  Transportation Needs: Not on file  Physical Activity: Not on file  Stress: Not on file  Social Connections: Not on file   Family  History  Problem Relation Age of Onset  . Cancer Mother        breast  . Arthritis Mother   . Diabetes Father   . Cancer Maternal Aunt        ? type  . Cancer Maternal Uncle        pancreas  . Kidney disease Paternal Uncle   . Diabetes Paternal Grandmother    Allergies  Allergen Reactions  . Other Anaphylaxis    [place] nuts, ?filaberts and hazelnuts  . Plum Pulp Anaphylaxis  . Shellfish-Derived Products Anaphylaxis  . Theophyllines Shortness Of Breath and Diarrhea  . Darvocet [Propoxyphene N-Acetaminophen] Other (See Comments)    halluciations  . Influenza Vaccines   . Ivp Dye [Iodinated Diagnostic Agents]   . Wheat Bran    Prior to Admission medications   Medication Sig Start Date End Date Taking? Authorizing Provider  albuterol (PROVENTIL HFA;VENTOLIN HFA) 108 (90 BASE) MCG/ACT inhaler Inhale 2 puffs into the lungs every 6 (six) hours as needed for wheezing. 09/05/14   Sandford Craze, NP  albuterol (PROVENTIL) (2.5 MG/3ML) 0.083% nebulizer solution Take 3 mLs (2.5 mg total) by nebulization every 6 (six) hours as needed for wheezing. 06/03/13  Sandford Craze, NP  Ascorbic Acid (VITAMIN C PO) Take 1 tablet by mouth daily.    [provider]  gabapentin (NEURONTIN) 300 MG capsule Take 300 mg by mouth 3 (three) times daily.    [provider]  LECITHIN PO Take 1 tablet by mouth daily.    [provider]  milk thistle 175 MG tablet Take 175 mg by mouth every evening.    [provider]  Misc Natural Products (DANDELION ROOT PO) Take 1 tablet by mouth daily.    [provider]  OxyCODONE (OXYCONTIN) 20 mg T12A 12 hr tablet Take 20 mg by mouth every 12 (twelve) hours.    [provider]  Oxycodone HCl 20 MG TABS Take 20 mg by mouth every 6 (six) hours as needed (breakthrough pain).    [provider]  promethazine (PHENERGAN) 25 MG tablet Take 1 tablet (25 mg total) by mouth every 8 (eight) hours as needed for  nausea or vomiting. 06/12/14   Sandford Craze, NP  sertraline (ZOLOFT) 100 MG tablet Take 50 mg by mouth at bedtime. Takes in pm    [provider]  VITAMIN E PO Take 1 tablet by mouth daily.    [provider]     Positive ROS: Otherwise negative  All other systems have been reviewed and were otherwise negative with the exception of those mentioned in the HPI and as above.  Physical Exam: Constitutional: Alert, well-appearing, no acute distress Ears: External ears without lesions or tenderness.  Left ear canal and TM are clear.  Right ear canal reveals a external otitis with swelling of the lateral portion of the ear canal.  The TM is clear.  Ear canal was cleaned with suction and after cleaning the ear canal I applied gentian violet Ciprodex and CSF powder to the right ear. Nasal: External nose without lesions.. Clear nasal passages Oral: Lips and gums without lesions. Tongue and palate mucosa without lesions. Posterior oropharynx clear. Neck: No palpable adenopathy or masses Respiratory: Breathing comfortably  Skin: No facial/neck lesions or rash noted.  Cerumen impaction removal  Date/Time: 11/08/2020 1:53 PM Performed by: Drema Halon, MD Authorized by: Drema Halon, MD   Consent:    Consent obtained:  Verbal   Consent given by:  Patient   Risks discussed:  Pain and bleeding Procedure details:    Location:  R ear   Procedure type: suction   Post-procedure details:    Inspection:  TM intact and canal normal   Hearing quality:  Improved   Patient tolerance of procedure:  Tolerated well, no immediate complications Comments:     Right ear canal was cleaned with suction as well as alcohol and hydroperoxide eardrops.  After cleaning the ear canal I applied Ciprodex, gentian violet and CSF powder to the right ear.    Assessment: Right external otitis  Plan: Prescribed Cortisporin otic suspension drops 4 to 5 drops twice daily for 5  days for the acute infection. Also gave her a prescription for Diprolene 0.05% cream or lotion to use twice daily for 3 to 4 days as needed itching.   Narda Bonds, MD

## 2020-11-14 ENCOUNTER — Ambulatory Visit (INDEPENDENT_AMBULATORY_CARE_PROVIDER_SITE_OTHER): Payer: Medicare Other | Admitting: Otolaryngology

## 2020-12-24 DIAGNOSIS — G894 Chronic pain syndrome: Secondary | ICD-10-CM | POA: Diagnosis not present

## 2020-12-24 DIAGNOSIS — Z79891 Long term (current) use of opiate analgesic: Secondary | ICD-10-CM | POA: Diagnosis not present

## 2020-12-24 DIAGNOSIS — M47816 Spondylosis without myelopathy or radiculopathy, lumbar region: Secondary | ICD-10-CM | POA: Diagnosis not present

## 2020-12-24 DIAGNOSIS — M961 Postlaminectomy syndrome, not elsewhere classified: Secondary | ICD-10-CM | POA: Diagnosis not present

## 2021-02-18 DIAGNOSIS — M47816 Spondylosis without myelopathy or radiculopathy, lumbar region: Secondary | ICD-10-CM | POA: Diagnosis not present

## 2021-02-18 DIAGNOSIS — G894 Chronic pain syndrome: Secondary | ICD-10-CM | POA: Diagnosis not present

## 2021-02-18 DIAGNOSIS — Z79891 Long term (current) use of opiate analgesic: Secondary | ICD-10-CM | POA: Diagnosis not present

## 2021-02-18 DIAGNOSIS — M961 Postlaminectomy syndrome, not elsewhere classified: Secondary | ICD-10-CM | POA: Diagnosis not present

## 2021-03-15 DIAGNOSIS — J4520 Mild intermittent asthma, uncomplicated: Secondary | ICD-10-CM | POA: Diagnosis not present

## 2021-03-15 DIAGNOSIS — G4733 Obstructive sleep apnea (adult) (pediatric): Secondary | ICD-10-CM | POA: Diagnosis not present

## 2021-04-18 DIAGNOSIS — Z79891 Long term (current) use of opiate analgesic: Secondary | ICD-10-CM | POA: Diagnosis not present

## 2021-04-18 DIAGNOSIS — G894 Chronic pain syndrome: Secondary | ICD-10-CM | POA: Diagnosis not present

## 2021-04-18 DIAGNOSIS — M47816 Spondylosis without myelopathy or radiculopathy, lumbar region: Secondary | ICD-10-CM | POA: Diagnosis not present

## 2021-04-18 DIAGNOSIS — M961 Postlaminectomy syndrome, not elsewhere classified: Secondary | ICD-10-CM | POA: Diagnosis not present

## 2021-06-12 DIAGNOSIS — G894 Chronic pain syndrome: Secondary | ICD-10-CM | POA: Diagnosis not present

## 2021-06-12 DIAGNOSIS — M47816 Spondylosis without myelopathy or radiculopathy, lumbar region: Secondary | ICD-10-CM | POA: Diagnosis not present

## 2021-06-12 DIAGNOSIS — Z79891 Long term (current) use of opiate analgesic: Secondary | ICD-10-CM | POA: Diagnosis not present

## 2021-06-12 DIAGNOSIS — M961 Postlaminectomy syndrome, not elsewhere classified: Secondary | ICD-10-CM | POA: Diagnosis not present

## 2021-06-17 DIAGNOSIS — G4733 Obstructive sleep apnea (adult) (pediatric): Secondary | ICD-10-CM | POA: Diagnosis not present

## 2021-06-17 DIAGNOSIS — J4520 Mild intermittent asthma, uncomplicated: Secondary | ICD-10-CM | POA: Diagnosis not present

## 2021-08-07 DIAGNOSIS — M961 Postlaminectomy syndrome, not elsewhere classified: Secondary | ICD-10-CM | POA: Diagnosis not present

## 2021-08-07 DIAGNOSIS — Z79891 Long term (current) use of opiate analgesic: Secondary | ICD-10-CM | POA: Diagnosis not present

## 2021-08-07 DIAGNOSIS — M47816 Spondylosis without myelopathy or radiculopathy, lumbar region: Secondary | ICD-10-CM | POA: Diagnosis not present

## 2021-08-07 DIAGNOSIS — G894 Chronic pain syndrome: Secondary | ICD-10-CM | POA: Diagnosis not present

## 2021-10-10 DIAGNOSIS — G894 Chronic pain syndrome: Secondary | ICD-10-CM | POA: Diagnosis not present

## 2021-10-10 DIAGNOSIS — M47816 Spondylosis without myelopathy or radiculopathy, lumbar region: Secondary | ICD-10-CM | POA: Diagnosis not present

## 2021-10-10 DIAGNOSIS — M961 Postlaminectomy syndrome, not elsewhere classified: Secondary | ICD-10-CM | POA: Diagnosis not present

## 2021-10-10 DIAGNOSIS — Z79891 Long term (current) use of opiate analgesic: Secondary | ICD-10-CM | POA: Diagnosis not present

## 2021-10-24 DIAGNOSIS — G4733 Obstructive sleep apnea (adult) (pediatric): Secondary | ICD-10-CM | POA: Diagnosis not present

## 2021-10-24 DIAGNOSIS — J4520 Mild intermittent asthma, uncomplicated: Secondary | ICD-10-CM | POA: Diagnosis not present

## 2021-11-25 DIAGNOSIS — M47816 Spondylosis without myelopathy or radiculopathy, lumbar region: Secondary | ICD-10-CM | POA: Diagnosis not present

## 2021-11-25 DIAGNOSIS — M961 Postlaminectomy syndrome, not elsewhere classified: Secondary | ICD-10-CM | POA: Diagnosis not present

## 2021-11-25 DIAGNOSIS — G894 Chronic pain syndrome: Secondary | ICD-10-CM | POA: Diagnosis not present

## 2021-11-25 DIAGNOSIS — Z79891 Long term (current) use of opiate analgesic: Secondary | ICD-10-CM | POA: Diagnosis not present

## 2022-01-21 DIAGNOSIS — Z79891 Long term (current) use of opiate analgesic: Secondary | ICD-10-CM | POA: Diagnosis not present

## 2022-01-21 DIAGNOSIS — G894 Chronic pain syndrome: Secondary | ICD-10-CM | POA: Diagnosis not present

## 2022-01-21 DIAGNOSIS — M961 Postlaminectomy syndrome, not elsewhere classified: Secondary | ICD-10-CM | POA: Diagnosis not present

## 2022-01-21 DIAGNOSIS — M47816 Spondylosis without myelopathy or radiculopathy, lumbar region: Secondary | ICD-10-CM | POA: Diagnosis not present

## 2022-04-08 DIAGNOSIS — G894 Chronic pain syndrome: Secondary | ICD-10-CM | POA: Diagnosis not present

## 2022-04-08 DIAGNOSIS — M961 Postlaminectomy syndrome, not elsewhere classified: Secondary | ICD-10-CM | POA: Diagnosis not present

## 2022-04-08 DIAGNOSIS — Z79891 Long term (current) use of opiate analgesic: Secondary | ICD-10-CM | POA: Diagnosis not present

## 2022-04-08 DIAGNOSIS — M47816 Spondylosis without myelopathy or radiculopathy, lumbar region: Secondary | ICD-10-CM | POA: Diagnosis not present

## 2022-05-22 DIAGNOSIS — M961 Postlaminectomy syndrome, not elsewhere classified: Secondary | ICD-10-CM | POA: Diagnosis not present

## 2022-05-22 DIAGNOSIS — G894 Chronic pain syndrome: Secondary | ICD-10-CM | POA: Diagnosis not present

## 2022-05-22 DIAGNOSIS — M47816 Spondylosis without myelopathy or radiculopathy, lumbar region: Secondary | ICD-10-CM | POA: Diagnosis not present

## 2022-05-22 DIAGNOSIS — Z79891 Long term (current) use of opiate analgesic: Secondary | ICD-10-CM | POA: Diagnosis not present

## 2022-07-17 DIAGNOSIS — M961 Postlaminectomy syndrome, not elsewhere classified: Secondary | ICD-10-CM | POA: Diagnosis not present

## 2022-07-17 DIAGNOSIS — M47816 Spondylosis without myelopathy or radiculopathy, lumbar region: Secondary | ICD-10-CM | POA: Diagnosis not present

## 2022-07-17 DIAGNOSIS — G894 Chronic pain syndrome: Secondary | ICD-10-CM | POA: Diagnosis not present

## 2022-07-17 DIAGNOSIS — Z79891 Long term (current) use of opiate analgesic: Secondary | ICD-10-CM | POA: Diagnosis not present

## 2022-09-23 DIAGNOSIS — Z79891 Long term (current) use of opiate analgesic: Secondary | ICD-10-CM | POA: Diagnosis not present

## 2022-09-23 DIAGNOSIS — G894 Chronic pain syndrome: Secondary | ICD-10-CM | POA: Diagnosis not present

## 2022-09-23 DIAGNOSIS — M961 Postlaminectomy syndrome, not elsewhere classified: Secondary | ICD-10-CM | POA: Diagnosis not present

## 2022-09-23 DIAGNOSIS — M47816 Spondylosis without myelopathy or radiculopathy, lumbar region: Secondary | ICD-10-CM | POA: Diagnosis not present

## 2022-10-23 DIAGNOSIS — G894 Chronic pain syndrome: Secondary | ICD-10-CM | POA: Diagnosis not present

## 2022-10-23 DIAGNOSIS — M47816 Spondylosis without myelopathy or radiculopathy, lumbar region: Secondary | ICD-10-CM | POA: Diagnosis not present

## 2022-10-23 DIAGNOSIS — Z79891 Long term (current) use of opiate analgesic: Secondary | ICD-10-CM | POA: Diagnosis not present

## 2022-10-23 DIAGNOSIS — M961 Postlaminectomy syndrome, not elsewhere classified: Secondary | ICD-10-CM | POA: Diagnosis not present

## 2022-11-29 ENCOUNTER — Emergency Department (HOSPITAL_COMMUNITY): Payer: Medicare Other

## 2022-11-29 ENCOUNTER — Emergency Department (HOSPITAL_COMMUNITY)
Admission: EM | Admit: 2022-11-29 | Discharge: 2022-11-29 | Disposition: A | Discharge status: Home / Self Care | Payer: Medicare Other | Attending: Emergency Medicine | Admitting: Emergency Medicine

## 2022-11-29 ENCOUNTER — Encounter (HOSPITAL_COMMUNITY): Payer: Self-pay

## 2022-11-29 DIAGNOSIS — S0990XA Unspecified injury of head, initial encounter: Secondary | ICD-10-CM | POA: Diagnosis not present

## 2022-11-29 DIAGNOSIS — J45909 Unspecified asthma, uncomplicated: Secondary | ICD-10-CM | POA: Diagnosis not present

## 2022-11-29 DIAGNOSIS — R519 Headache, unspecified: Secondary | ICD-10-CM | POA: Diagnosis present

## 2022-11-29 DIAGNOSIS — M25562 Pain in left knee: Secondary | ICD-10-CM | POA: Diagnosis not present

## 2022-11-29 DIAGNOSIS — Z743 Need for continuous supervision: Secondary | ICD-10-CM | POA: Diagnosis not present

## 2022-11-29 DIAGNOSIS — E119 Type 2 diabetes mellitus without complications: Secondary | ICD-10-CM | POA: Insufficient documentation

## 2022-11-29 DIAGNOSIS — Z23 Encounter for immunization: Secondary | ICD-10-CM | POA: Diagnosis not present

## 2022-11-29 DIAGNOSIS — S0081XA Abrasion of other part of head, initial encounter: Secondary | ICD-10-CM | POA: Insufficient documentation

## 2022-11-29 DIAGNOSIS — W01198A Fall on same level from slipping, tripping and stumbling with subsequent striking against other object, initial encounter: Secondary | ICD-10-CM | POA: Insufficient documentation

## 2022-11-29 DIAGNOSIS — W19XXXA Unspecified fall, initial encounter: Secondary | ICD-10-CM | POA: Diagnosis not present

## 2022-11-29 DIAGNOSIS — R609 Edema, unspecified: Secondary | ICD-10-CM | POA: Diagnosis not present

## 2022-11-29 DIAGNOSIS — S0003XA Contusion of scalp, initial encounter: Secondary | ICD-10-CM | POA: Diagnosis not present

## 2022-11-29 DIAGNOSIS — T148XXA Other injury of unspecified body region, initial encounter: Secondary | ICD-10-CM

## 2022-11-29 DIAGNOSIS — S199XXA Unspecified injury of neck, initial encounter: Secondary | ICD-10-CM | POA: Diagnosis not present

## 2022-11-29 DIAGNOSIS — S80919A Unspecified superficial injury of unspecified knee, initial encounter: Secondary | ICD-10-CM | POA: Diagnosis not present

## 2022-11-29 DIAGNOSIS — R58 Hemorrhage, not elsewhere classified: Secondary | ICD-10-CM | POA: Diagnosis not present

## 2022-11-29 DIAGNOSIS — M25561 Pain in right knee: Secondary | ICD-10-CM | POA: Diagnosis not present

## 2022-11-29 MED ORDER — TETANUS-DIPHTH-ACELL PERTUSSIS 5-2.5-18.5 LF-MCG/0.5 IM SUSY
0.5000 mL | PREFILLED_SYRINGE | Freq: Once | INTRAMUSCULAR | Status: AC
  Administered 2022-11-29: 0.5 mL via INTRAMUSCULAR
  Filled 2022-11-29: qty 0.5

## 2022-11-29 MED ORDER — PROMETHAZINE HCL 25 MG PO TABS: 25.0000 mg | ORAL_TABLET | Freq: Three times a day (TID) | ORAL | 0 refills | Status: AC | PRN

## 2022-11-29 MED ORDER — HYDROCODONE-ACETAMINOPHEN 5-325 MG PO TABS
1.0000 | ORAL_TABLET | Freq: Once | ORAL | Status: AC
  Administered 2022-11-29: 1 via ORAL
  Filled 2022-11-29: qty 1

## 2022-11-29 NOTE — Discharge Instructions (Signed)
Based on the events which brought you to the ER today, it is possible that you may have a concussion. A concussion occurs when there is a blow to the head or body, with enough force to shake the brain and disrupt how the brain functions. You may experience symptoms such as headaches, sensitivity to light/noise, dizziness, cognitive slowing, difficulty concentrating / remembering, trouble sleeping and drowsiness. These symptoms may last anywhere from hours/days to potentially weeks/months. While these symptoms are very frustrating and perhaps debilitating, it is important that you remember that they will improve over time. Everyone has a different rate of recovery; it is difficult to predict when your symptoms will resolve. In order to allow for your brain to heal after the injury, we recommend that you see your primary physician or a physician knowledgeable in concussion management. We also advise you to let your body and brain rest: avoid physical activities (sports, gym, and exercise) and reduce cognitive demands (reading, texting, TV watching, computer use, video games, etc). School attendance, after-school activities and work may need to be modified to avoid increasing symptoms. We recommend against driving until until all symptoms have resolved. Come back to the ER right away if you are having repeated episodes of vomiting, severe/worsening headache/dizziness or any other symptom that alarms you. We recommended that someone stay with you for the next 24 hours to monitor for these worrisome symptoms.   It was a pleasure caring for you today in the emergency department.  Please return to the emergency department for any worsening or worrisome symptoms.  

## 2022-11-29 NOTE — ED Provider Notes (Signed)
[facility] Provider Note  CSN: [id number] Arrival date & time: 11/29/22 1904  Chief Complaint(s) Fall  HPI Makayla Olson is a 67 y.o. female with past medical history as below, significant for chronic pain syndrome, Jehovah's Witness, OSA, DM who presents to the ED with complaint of fall.  Patient reports just prior to arrival she was pushed by her dog and she fell into the door and into the shelving unit by the front door.  No LOC, no thinners, hit her face on the door and top of her head on the shelf.  Hit both of her knees on the ground.  She was ambulatory after the event.  No LOC, no nausea or vomiting.  No vision or hearing changes, no numbness or tingling seems new.  No pain with eye movement.  Unsure of last tetanus shot.  No medications prior to arrival.  She has mild headache  Past Medical History Past Medical History:  Diagnosis Date   Allergy    Arthritis    Asthma    Depression    Diabetes mellitus without complication ([place])    controlled with diet   OSA (obstructive sleep apnea) 05/27/2013   Personal history of colonic polyps    Personal history of kidney stones    Refusal of blood transfusions as patient is Jehovah's Witness    Patient Active Problem List   Diagnosis Date Noted   Refusal of blood transfusions as patient is Jehovah's Witness 05/27/2013   OSA (obstructive sleep apnea) 05/27/2013   DM2 (diabetes mellitus, type 2) ([place]) 05/27/2013   Asthma 05/27/2013   Depression 05/27/2013   Chronic pain syndrome 05/27/2013   Hair loss 05/27/2013   Home Medication(s) Prior to Admission medications   Medication Sig Start Date End Date Taking? Authorizing Provider  promethazine (PHENERGAN) 25 MG tablet Take 1 tablet (25 mg total) by mouth every 8 (eight) hours as needed for nausea or vomiting. 11/29/22  Yes Wynona Dove A, DO  albuterol (PROVENTIL HFA;VENTOLIN HFA) 108 (90 BASE) MCG/ACT inhaler Inhale 2 puffs into the  lungs every 6 (six) hours as needed for wheezing. 09/05/14   Debbrah Alar, NP  albuterol (PROVENTIL) (2.5 MG/3ML) 0.083% nebulizer solution Take 3 mLs (2.5 mg total) by nebulization every 6 (six) hours as needed for wheezing. 06/03/13   Debbrah Alar, NP  Ascorbic Acid (VITAMIN C PO) Take 1 tablet by mouth daily.    [provider]  gabapentin (NEURONTIN) 300 MG capsule Take 300 mg by mouth 3 (three) times daily.    [provider]  LECITHIN PO Take 1 tablet by mouth daily.    [provider]  milk thistle 175 MG tablet Take 175 mg by mouth every evening.    [provider]  Misc Natural Products (DANDELION ROOT PO) Take 1 tablet by mouth daily.    [provider]  OxyCODONE (OXYCONTIN) 20 mg T12A 12 hr tablet Take 20 mg by mouth every 12 (twelve) hours.    [provider]  Oxycodone HCl 20 MG TABS Take 20 mg by mouth every 6 (six) hours as needed (breakthrough pain).    [provider]  sertraline (ZOLOFT) 100 MG tablet Take 50 mg by mouth at bedtime. Takes in pm    [provider]  VITAMIN E PO Take 1 tablet by mouth daily.    [provider]  Past Surgical History Past Surgical History:  Procedure Laterality Date   BACK SURGERY  1990, 2000, 2001, 2012   4 back surgeries, last on 2012, 8 screws and rods, spinal fusion   DILATION AND CURETTAGE OF UTERUS     x 3   miscarrigae     x 3 with D&C   TONSILLECTOMY     TONSILLECTOMY  1960   Family History Family History  Problem Relation Age of Onset   Cancer Mother        breast   Arthritis Mother    Diabetes Father    Cancer Maternal Aunt        ? type   Cancer Maternal Uncle        pancreas   Kidney disease Paternal Uncle    Diabetes Paternal Grandmother     Social History Social History   Tobacco Use    Smoking status: Never   Smokeless tobacco: Never  Substance Use Topics   Alcohol use: Yes    Comment: rarely   Drug use: No   Allergies Other, Plum pulp, Shellfish-derived products, Theophyllines, Darvocet [propoxyphene n-acetaminophen], Influenza vaccines, Ivp dye [iodinated contrast media], and Wheat  Review of Systems Review of Systems  Constitutional:  Negative for activity change and fever.  HENT:  Negative for facial swelling and trouble swallowing.   Eyes:  Negative for discharge and redness.  Respiratory:  Negative for cough and shortness of breath.   Cardiovascular:  Negative for chest pain and palpitations.  Gastrointestinal:  Negative for abdominal pain and nausea.  Genitourinary:  Negative for dysuria and flank pain.  Musculoskeletal:  Positive for arthralgias. Negative for back pain and gait problem.  Skin:  Positive for wound. Negative for pallor and rash.  Neurological:  Positive for headaches. Negative for syncope.    Physical Exam Vital Signs  I have reviewed the triage vital signs BP (!) 170/96 (BP Location: Left Arm)   Pulse 95   Temp 99.3 F (37.4 C) (Oral)   Resp 18   Ht 5' (1.524 m)   Wt 68 kg   SpO2 95%   BMI 29.29 kg/m  Physical Exam Vitals and nursing note reviewed.  Constitutional:      General: She is not in acute distress.    Appearance: Normal appearance.  HENT:     Head: Normocephalic. Abrasion present. No right periorbital erythema or left periorbital erythema.     Jaw: There is normal jaw occlusion. No trismus.     Comments: Abrasion forehead Small abrasion left cheek No drooling trismus or stridor, no obvious dental fracture     Right Ear: External ear normal.     Left Ear: External ear normal.     Nose: Nose normal.     Mouth/Throat:     Mouth: Mucous membranes are moist.  Eyes:     General: No scleral icterus.       Right eye: No discharge.        Left eye: No discharge.     Extraocular Movements: Extraocular movements  intact.     Pupils: Pupils are equal, round, and reactive to light.  Cardiovascular:     Rate and Rhythm: Normal rate and regular rhythm.     Pulses: Normal pulses.     Heart sounds: Normal heart sounds.  Pulmonary:     Effort: Pulmonary effort is normal. No respiratory distress.     Breath sounds: Normal breath sounds.  Abdominal:     General: Abdomen is  flat.     Tenderness: There is no abdominal tenderness.  Musculoskeletal:        General: Normal range of motion.     Cervical back: Normal range of motion.     Right lower leg: No edema.     Left lower leg: No edema.  Skin:    General: Skin is warm and dry.     Capillary Refill: Capillary refill takes less than 2 seconds.  Neurological:     Mental Status: She is alert and oriented to person, place, and time.     GCS: GCS eye subscore is 4. GCS verbal subscore is 5. GCS motor subscore is 6.     Cranial Nerves: Cranial nerves 2-12 are intact.     Sensory: Sensation is intact.     Motor: Motor function is intact.     Coordination: Coordination is intact.     Comments: Strength symmetric bilateral upper and lower extremities 5/5  Psychiatric:        Mood and Affect: Mood normal.        Behavior: Behavior normal.     ED Results and Treatments Labs (all labs ordered are listed, but only abnormal results are displayed) Labs Reviewed - No data to display                                                                                                                        Radiology DG Knee Complete 4 Views Right  Result Date: 11/29/2022 CLINICAL DATA:  Fall with knee pain. EXAM: RIGHT KNEE - COMPLETE 4+ VIEW COMPARISON:  None Available. FINDINGS: No evidence of fracture, dislocation, or joint effusion. There is mild lateral and patellofemoral compartment osteoarthritis. There is a superior patellar enthesophyte. Soft tissues are unremarkable. IMPRESSION: No acute osseous injury. Electronically Signed   By: Zerita Boers M.D.   On:  11/29/2022 20:52   DG Knee Complete 4 Views Left  Result Date: 11/29/2022 CLINICAL DATA:  Fall with knee pain EXAM: LEFT KNEE - COMPLETE 4+ VIEW COMPARISON:  Knee radiographs dated 01/06/2014. FINDINGS: No evidence of fracture, dislocation, or joint effusion. There is minimal lateral and patellofemoral compartment osteoarthritis and a superior patellar enthesophyte. Soft tissues are unremarkable. IMPRESSION: No acute osseous injury. Electronically Signed   By: Zerita Boers M.D.   On: 11/29/2022 20:51   CT Head Wo Contrast  Result Date: 11/29/2022 CLINICAL DATA:  Trauma EXAM: CT HEAD WITHOUT CONTRAST CT CERVICAL SPINE WITHOUT CONTRAST TECHNIQUE: Multidetector CT imaging of the head and cervical spine was performed following the standard protocol without intravenous contrast. Multiplanar CT image reconstructions of the cervical spine were also generated. RADIATION DOSE REDUCTION: This exam was performed according to the departmental dose-optimization program which includes automated exposure control, adjustment of the mA and/or kV according to patient size and/or use of iterative reconstruction technique. COMPARISON:  None Available. FINDINGS: CT HEAD FINDINGS Brain: No acute territorial infarction, hemorrhage or intracranial mass. The ventricles are nonenlarged. Vascular: No hyperdense vessels.  No unexpected calcification. Skull: Normal. Negative for fracture or focal lesion. Sinuses/Orbits: No acute finding. Other: Moderate right forehead scalp hematoma CT CERVICAL SPINE FINDINGS Alignment: Straightening of the cervical spine. No subluxation. Facet alignment is within normal limits. Skull base and vertebrae: No acute fracture. No primary bone lesion or focal pathologic process. Soft tissues and spinal canal: Thickened prevertebral soft tissues with prominent prevertebral fat stripe. Disc levels: Multilevel degenerative changes. Partial ankylosis C4-C5 and C5-C6. Moderate severe diffuse disc space narrowing  C4 through T1. Multilevel facet degenerative changes with facet ankylosis C4 through C6. Upper chest: Negative. Other: None IMPRESSION: 1. Negative non contrasted CT appearance of the brain. Moderate right scalp hematoma 2. Straightening of the cervical spine with multilevel degenerative changes. No fracture identified. Prominent prevertebral soft tissues, largely related to prominent fat in the prevertebral space. Electronically Signed   By: Donavan Foil M.D.   On: 11/29/2022 20:25   CT Cervical Spine Wo Contrast  Result Date: 11/29/2022 CLINICAL DATA:  Trauma EXAM: CT HEAD WITHOUT CONTRAST CT CERVICAL SPINE WITHOUT CONTRAST TECHNIQUE: Multidetector CT imaging of the head and cervical spine was performed following the standard protocol without intravenous contrast. Multiplanar CT image reconstructions of the cervical spine were also generated. RADIATION DOSE REDUCTION: This exam was performed according to the departmental dose-optimization program which includes automated exposure control, adjustment of the mA and/or kV according to patient size and/or use of iterative reconstruction technique. COMPARISON:  None Available. FINDINGS: CT HEAD FINDINGS Brain: No acute territorial infarction, hemorrhage or intracranial mass. The ventricles are nonenlarged. Vascular: No hyperdense vessels.  No unexpected calcification. Skull: Normal. Negative for fracture or focal lesion. Sinuses/Orbits: No acute finding. Other: Moderate right forehead scalp hematoma CT CERVICAL SPINE FINDINGS Alignment: Straightening of the cervical spine. No subluxation. Facet alignment is within normal limits. Skull base and vertebrae: No acute fracture. No primary bone lesion or focal pathologic process. Soft tissues and spinal canal: Thickened prevertebral soft tissues with prominent prevertebral fat stripe. Disc levels: Multilevel degenerative changes. Partial ankylosis C4-C5 and C5-C6. Moderate severe diffuse disc space narrowing C4 through  T1. Multilevel facet degenerative changes with facet ankylosis C4 through C6. Upper chest: Negative. Other: None IMPRESSION: 1. Negative non contrasted CT appearance of the brain. Moderate right scalp hematoma 2. Straightening of the cervical spine with multilevel degenerative changes. No fracture identified. Prominent prevertebral soft tissues, largely related to prominent fat in the prevertebral space. Electronically Signed   By: Donavan Foil M.D.   On: 11/29/2022 20:25    Pertinent labs & imaging results that were available during my care of the patient were reviewed by me and considered in my medical decision making (see MDM for details).  Medications Ordered in ED Medications  HYDROcodone-acetaminophen (NORCO/VICODIN) 5-325 MG per tablet 1 tablet (1 tablet Oral Given 11/29/22 1943)  Tdap (BOOSTRIX) injection 0.5 mL (0.5 mLs Intramuscular Given 11/29/22 1944)  Procedures Procedures  (including critical care time)  Medical Decision Making / ED Course    Medical Decision Making:    Makayla Olson is a 67 y.o. female  with past medical history as below, significant for chronic pain syndrome, Jehovah's Witness, OSA, DM who presents to the ED with complaint of fall.. The complaint involves an extensive differential diagnosis and also carries with it a high risk of complications and morbidity.  Serious etiology was considered. Ddx includes but is not limited to: Differential diagnoses for head trauma includes subdural hematoma, epidural hematoma, acute concussion, traumatic subarachnoid hemorrhage, cerebral contusions, etc.   Complete initial physical exam performed, notably the patient  was no acute distress, neuro nonfocal.    Reviewed and confirmed nursing documentation for past medical history, family history, social history.  Vital signs reviewed.     Clinical Course as of 11/29/22 2217  Sat Nov 29, 2022  1917 PDMP  11/20/2022 Morphine Sulfate Ir 30 Mg Tab 90.00   [SG]    Clinical Course User Index [SG] Jeanell Sparrow, DO   Patient presents with low mechanism head trauma. On initial evaluation patient appears in no acute distress, afebrile with normal vital signs. Patient has completely intact neurovascular exam, and pain improved in ED.   Patient with ground-level fall with forehead and posterior scalp injury.  Abrasion noted to forehead and face.  No repairable laceration.  Unsure of last tetanus, updated in ED.  Pain improved following Vicodin.  Imaging reviewed and stable.  Discussed concussion precautions with patient and family at bedside.  They will follow-up with PCP    Discussed possible etiology of concussion, signs and symptoms, and discharge instructions. Detailed discussions were had with the patient regarding current findings, and need for close f/u with PCP or on call doctor. The patient has been instructed to return immediately if the symptoms worsen in any way for re-evaluation. Patient verbalized understanding and is in agreement with current care plan. All questions answered prior to discharge     Additional history obtained: -Additional history obtained from family -External records from outside source obtained and reviewed including: Chart review including previous notes, labs, imaging, consultation notes including BMP, home medications, primary care documentation   Lab Tests: Not applicable  EKG   EKG Interpretation  Date/Time:    Ventricular Rate:    PR Interval:    QRS Duration:   QT Interval:    QTC Calculation:   R Axis:     Text Interpretation:           Imaging Studies ordered: I ordered imaging studies including CT head/ neck, x-ray knee bilateral I independently visualized the following imaging with scope of interpretation limited to determining acute life threatening conditions  related to emergency care: stable I independently visualized and interpreted imaging. I agree with the radiologist interpretation   Medicines ordered and prescription drug management: Meds ordered this encounter  Medications   HYDROcodone-acetaminophen (NORCO/VICODIN) 5-325 MG per tablet 1 tablet   Tdap (BOOSTRIX) injection 0.5 mL   promethazine (PHENERGAN) 25 MG tablet    Sig: Take 1 tablet (25 mg total) by mouth every 8 (eight) hours as needed for nausea or vomiting.    Dispense:  5 tablet    Refill:  0    -I have reviewed the patients home medicines and have made adjustments as needed   Consultations Obtained: Not applicable  Cardiac Monitoring: Not applicable Social Determinants of Health:  Diagnosis or treatment significantly limited by social determinants  of health: na   Reevaluation: After the interventions noted above, I reevaluated the patient and found that they have improved  Co morbidities that complicate the patient evaluation  Past Medical History:  Diagnosis Date   Allergy    Arthritis    Asthma    Depression    Diabetes mellitus without complication ([place])    controlled with diet   OSA (obstructive sleep apnea) 05/27/2013   Personal history of colonic polyps    Personal history of kidney stones    Refusal of blood transfusions as patient is Jehovah's Witness       Dispostion: Disposition decision including need for hospitalization was considered, and patient discharged from emergency department.    Final Clinical Impression(s) / ED Diagnoses Final diagnoses:  Fall, initial encounter  Abrasion  Minor head injury, initial encounter     This chart was dictated using voice recognition software.  Despite best efforts to proofread,  errors can occur which can change the documentation meaning.    Jeanell Sparrow, DO 11/29/22 2218

## 2022-11-29 NOTE — ED Triage Notes (Signed)
Pt presents to ED for evaluation of injuries related to mechanical fall with her dog this evening. Pt reports pain to forehead, left orbit, and bilateral knees. Denies LOC.

## 2022-12-03 ENCOUNTER — Telehealth: Payer: Self-pay

## 2022-12-03 NOTE — Telephone Encounter (Signed)
        Patient  visited [facility] on 3/16     Telephone encounter attempt :  1st  A HIPAA compliant voice message was left requesting a return call.  Instructed patient to call back    [facility] [phone] [address], [place], [place] [address] Phone: [phone] Email: [email or web address]

## 2022-12-04 ENCOUNTER — Telehealth: Payer: Self-pay

## 2022-12-04 NOTE — Telephone Encounter (Signed)
        Patient  visited [facility] on 3/16   Telephone encounter attempt :  2nd  A HIPAA compliant voice message was left requesting a return call.  Instructed patient to call back .    [facility] [phone] [address], [place], [place] [address] Phone: [phone] Email: [email or web address]

## 2022-12-05 ENCOUNTER — Telehealth: Payer: Self-pay

## 2022-12-05 NOTE — Telephone Encounter (Signed)
     Patient  visit on 3/16  at [facility] you been able to follow up with your primary care physician? Yes   The patient was or was not able to obtain any needed medicine or equipment. Yes   Are there diet recommendations that you are having difficulty following? Na   Patient expresses understanding of discharge instructions and education provided has no other needs at this time.  Yes      [facility] [phone] [address], [place], [place] [address] Phone: [phone] Email: [email or web address]

## 2022-12-22 DIAGNOSIS — G4733 Obstructive sleep apnea (adult) (pediatric): Secondary | ICD-10-CM | POA: Diagnosis not present

## 2022-12-22 DIAGNOSIS — J4520 Mild intermittent asthma, uncomplicated: Secondary | ICD-10-CM | POA: Diagnosis not present

## 2022-12-25 DIAGNOSIS — G894 Chronic pain syndrome: Secondary | ICD-10-CM | POA: Diagnosis not present

## 2022-12-25 DIAGNOSIS — Z79891 Long term (current) use of opiate analgesic: Secondary | ICD-10-CM | POA: Diagnosis not present

## 2022-12-25 DIAGNOSIS — M961 Postlaminectomy syndrome, not elsewhere classified: Secondary | ICD-10-CM | POA: Diagnosis not present

## 2022-12-25 DIAGNOSIS — R519 Headache, unspecified: Secondary | ICD-10-CM | POA: Diagnosis not present

## 2023-01-23 ENCOUNTER — Ambulatory Visit
Admission: EM | Admit: 2023-01-23 | Discharge: 2023-01-23 | Disposition: A | Discharge status: Home / Self Care | Payer: Medicare Other

## 2023-01-23 DIAGNOSIS — L853 Xerosis cutis: Secondary | ICD-10-CM

## 2023-01-23 MED ORDER — HYDROXYZINE HCL 25 MG PO TABS: 12.5000 mg | ORAL_TABLET | Freq: Three times a day (TID) | ORAL | 0 refills | Status: AC | PRN

## 2023-01-23 NOTE — ED Triage Notes (Signed)
Pt c/o rash to back x 2 days-some relief with aloe plant-NAD-steady gait

## 2023-01-23 NOTE — ED Provider Notes (Signed)
[facility]  Note:  This document was prepared using Conservation officer, historic buildings and may include unintentional dictation errors.  MRN: [id number] DOB: 09/03/1956  Subjective:   Makayla Olson is a 67 y.o. female presenting for 2 day history of a pruritic rash over her back. Would like to have a skin culture. Has concerns that it is something severe. Had shingles twice and would like to make sure this is not the kind of rash she has. No fever, drainage or bleeding, redness, pain. Has primarily itching.   No current facility-administered medications for this encounter.  Current Outpatient Medications:    morphine (MS CONTIN) 30 MG 12 hr tablet, Take 30 mg by mouth 2 (two) times daily., Disp: , Rfl:    morphine (MSIR) 30 MG tablet, Take 30 mg by mouth 3 (three) times daily as needed., Disp: , Rfl:    valACYclovir (VALTREX) 1000 MG tablet, Take 1,000 mg by mouth every 8 (eight) hours., Disp: , Rfl:    albuterol (PROVENTIL HFA;VENTOLIN HFA) 108 (90 BASE) MCG/ACT inhaler, Inhale 2 puffs into the lungs every 6 (six) hours as needed for wheezing., Disp: 1 Inhaler, Rfl: 1   albuterol (PROVENTIL) (2.5 MG/3ML) 0.083% nebulizer solution, Take 3 mLs (2.5 mg total) by nebulization every 6 (six) hours as needed for wheezing., Disp: 75 mL, Rfl: 3   Ascorbic Acid (VITAMIN C PO), Take 1 tablet by mouth daily., Disp: , Rfl:    gabapentin (NEURONTIN) 300 MG capsule, Take 300 mg by mouth 3 (three) times daily., Disp: , Rfl:    LECITHIN PO, Take 1 tablet by mouth daily., Disp: , Rfl:    milk thistle 175 MG tablet, Take 175 mg by mouth every evening., Disp: , Rfl:    Misc Natural Products (DANDELION ROOT PO), Take 1 tablet by mouth daily., Disp: , Rfl:    OxyCODONE (OXYCONTIN) 20 mg T12A 12 hr tablet, Take 20 mg by mouth every 12 (twelve) hours., Disp: , Rfl:    Oxycodone HCl 20 MG TABS, Take 20 mg by mouth every 6 (six) hours as needed (breakthrough pain)., Disp: , Rfl:     promethazine (PHENERGAN) 25 MG tablet, Take 1 tablet (25 mg total) by mouth every 8 (eight) hours as needed for nausea or vomiting., Disp: 5 tablet, Rfl: 0   sertraline (ZOLOFT) 100 MG tablet, Take 50 mg by mouth at bedtime. Takes in pm, Disp: , Rfl:    VITAMIN E PO, Take 1 tablet by mouth daily., Disp: , Rfl:    Allergies  Allergen Reactions   Other Anaphylaxis    [place] nuts, ?filaberts and hazelnuts   Plum Pulp Anaphylaxis   Shellfish-Derived Products Anaphylaxis   Theophyllines Shortness Of Breath and Diarrhea   Darvocet [Propoxyphene N-Acetaminophen] Other (See Comments)    halluciations   Influenza Vaccines    Ivp Dye [Iodinated Contrast Media]    Wheat     Past Medical History:  Diagnosis Date   Allergy    Arthritis    Asthma    Depression    Diabetes mellitus without complication ([facility])    controlled with diet   OSA (obstructive sleep apnea) 05/27/2013   Personal history of colonic polyps    Personal history of kidney stones    Refusal of blood transfusions as patient is Jehovah's Witness      Past Surgical History:  Procedure Laterality Date   BACK SURGERY  1990, 2000, 2001, 2012   4 back surgeries, last on 2012,  8 screws and rods, spinal fusion   DILATION AND CURETTAGE OF UTERUS     x 3   miscarrigae     x 3 with D&C   TONSILLECTOMY     TONSILLECTOMY  1960    Family History  Problem Relation Age of Onset   Cancer Mother        breast   Arthritis Mother    Diabetes Father    Cancer Maternal Aunt        ? type   Cancer Maternal Uncle        pancreas   Kidney disease Paternal Uncle    Diabetes Paternal Grandmother     Social History   Tobacco Use   Smoking status: Never   Smokeless tobacco: Never  Vaping Use   Vaping Use: Never used  Substance Use Topics   Alcohol use: Yes    Comment: rarely   Drug use: No    ROS   Objective:   Vitals: BP (!) 146/85 (BP Location: Right Arm)   Pulse 93   Temp 98.8 F (37.1 C) (Oral)   Resp 20    SpO2 98%   Physical Exam Constitutional:      General: She is not in acute distress.    Appearance: Normal appearance. She is well-developed. She is not ill-appearing, toxic-appearing or diaphoretic.  HENT:     Head: Normocephalic and atraumatic.     Nose: Nose normal.     Mouth/Throat:     Mouth: Mucous membranes are moist.  Eyes:     General: No scleral icterus.       Right eye: No discharge.        Left eye: No discharge.     Extraocular Movements: Extraocular movements intact.  Cardiovascular:     Rate and Rhythm: Normal rate.  Pulmonary:     Effort: Pulmonary effort is normal.  Skin:    General: Skin is warm and dry.     Findings: No rash.  Neurological:     General: No focal deficit present.     Mental Status: She is alert and oriented to person, place, and time.  Psychiatric:        Mood and Affect: Mood normal.        Behavior: Behavior normal.       Assessment and Plan :   PDMP not reviewed this encounter.  1. Dry skin dermatitis    Will manage for dry skin dermatitis. Recommended daily hydrating, topical emollients. Hydroxyzine for itching. Patient refused topical steroids. Reviewed images with the patient for shingles, cellulitis and abscesses, fungal infections all of which are not visible on exam today. Overall skin exam is very reassuring. Return to clinic precautions reviewed.    Wallis Bamberg, [place] 01/23/23 1536

## 2023-01-23 NOTE — Discharge Instructions (Signed)
Use hydroxyzine for the itching of dry skin dermatitis. Apply Aquaphor once daily to help maintain and treat dry skin dermatitis. Hydrate well with at least 64 ounces of water.

## 2023-02-19 DIAGNOSIS — G894 Chronic pain syndrome: Secondary | ICD-10-CM | POA: Diagnosis not present

## 2023-02-19 DIAGNOSIS — M961 Postlaminectomy syndrome, not elsewhere classified: Secondary | ICD-10-CM | POA: Diagnosis not present

## 2023-02-19 DIAGNOSIS — Z79891 Long term (current) use of opiate analgesic: Secondary | ICD-10-CM | POA: Diagnosis not present

## 2023-02-19 DIAGNOSIS — M47816 Spondylosis without myelopathy or radiculopathy, lumbar region: Secondary | ICD-10-CM | POA: Diagnosis not present

## 2023-04-22 DIAGNOSIS — M961 Postlaminectomy syndrome, not elsewhere classified: Secondary | ICD-10-CM | POA: Diagnosis not present

## 2023-04-22 DIAGNOSIS — G894 Chronic pain syndrome: Secondary | ICD-10-CM | POA: Diagnosis not present

## 2023-04-22 DIAGNOSIS — M47816 Spondylosis without myelopathy or radiculopathy, lumbar region: Secondary | ICD-10-CM | POA: Diagnosis not present

## 2023-04-22 DIAGNOSIS — Z79891 Long term (current) use of opiate analgesic: Secondary | ICD-10-CM | POA: Diagnosis not present

## 2023-06-18 DIAGNOSIS — M961 Postlaminectomy syndrome, not elsewhere classified: Secondary | ICD-10-CM | POA: Diagnosis not present

## 2023-06-18 DIAGNOSIS — G894 Chronic pain syndrome: Secondary | ICD-10-CM | POA: Diagnosis not present

## 2023-06-18 DIAGNOSIS — M47816 Spondylosis without myelopathy or radiculopathy, lumbar region: Secondary | ICD-10-CM | POA: Diagnosis not present

## 2023-06-18 DIAGNOSIS — Z79891 Long term (current) use of opiate analgesic: Secondary | ICD-10-CM | POA: Diagnosis not present

## 2023-07-06 DIAGNOSIS — H35341 Macular cyst, hole, or pseudohole, right eye: Secondary | ICD-10-CM | POA: Diagnosis not present

## 2023-07-16 DIAGNOSIS — H35371 Puckering of macula, right eye: Secondary | ICD-10-CM | POA: Diagnosis not present

## 2023-07-16 DIAGNOSIS — H43813 Vitreous degeneration, bilateral: Secondary | ICD-10-CM | POA: Diagnosis not present

## 2023-07-16 DIAGNOSIS — H43391 Other vitreous opacities, right eye: Secondary | ICD-10-CM | POA: Diagnosis not present

## 2023-08-17 DIAGNOSIS — G894 Chronic pain syndrome: Secondary | ICD-10-CM | POA: Diagnosis not present

## 2023-08-17 DIAGNOSIS — Z79891 Long term (current) use of opiate analgesic: Secondary | ICD-10-CM | POA: Diagnosis not present

## 2023-08-17 DIAGNOSIS — M47816 Spondylosis without myelopathy or radiculopathy, lumbar region: Secondary | ICD-10-CM | POA: Diagnosis not present

## 2023-08-17 DIAGNOSIS — M961 Postlaminectomy syndrome, not elsewhere classified: Secondary | ICD-10-CM | POA: Diagnosis not present

## 2023-10-09 DIAGNOSIS — Z0000 Encounter for general adult medical examination without abnormal findings: Secondary | ICD-10-CM | POA: Diagnosis not present

## 2023-10-09 DIAGNOSIS — Z0189 Encounter for other specified special examinations: Secondary | ICD-10-CM | POA: Diagnosis not present

## 2023-10-09 DIAGNOSIS — Z136 Encounter for screening for cardiovascular disorders: Secondary | ICD-10-CM | POA: Diagnosis not present

## 2023-10-09 DIAGNOSIS — E119 Type 2 diabetes mellitus without complications: Secondary | ICD-10-CM | POA: Diagnosis not present

## 2023-10-09 DIAGNOSIS — Z1159 Encounter for screening for other viral diseases: Secondary | ICD-10-CM | POA: Diagnosis not present

## 2023-10-09 DIAGNOSIS — G4730 Sleep apnea, unspecified: Secondary | ICD-10-CM | POA: Diagnosis not present

## 2023-10-09 DIAGNOSIS — Z79899 Other long term (current) drug therapy: Secondary | ICD-10-CM | POA: Diagnosis not present

## 2023-10-09 DIAGNOSIS — G8929 Other chronic pain: Secondary | ICD-10-CM | POA: Diagnosis not present

## 2023-10-15 DIAGNOSIS — Z79891 Long term (current) use of opiate analgesic: Secondary | ICD-10-CM | POA: Diagnosis not present

## 2023-10-15 DIAGNOSIS — M47816 Spondylosis without myelopathy or radiculopathy, lumbar region: Secondary | ICD-10-CM | POA: Diagnosis not present

## 2023-10-15 DIAGNOSIS — M961 Postlaminectomy syndrome, not elsewhere classified: Secondary | ICD-10-CM | POA: Diagnosis not present

## 2023-10-15 DIAGNOSIS — G894 Chronic pain syndrome: Secondary | ICD-10-CM | POA: Diagnosis not present

## 2023-11-03 DIAGNOSIS — Z5320 Procedure and treatment not carried out because of patient's decision for unspecified reasons: Secondary | ICD-10-CM | POA: Diagnosis not present

## 2023-11-03 DIAGNOSIS — M79641 Pain in right hand: Secondary | ICD-10-CM | POA: Diagnosis not present

## 2023-11-03 DIAGNOSIS — Z23 Encounter for immunization: Secondary | ICD-10-CM | POA: Diagnosis not present

## 2023-11-03 DIAGNOSIS — Z0001 Encounter for general adult medical examination with abnormal findings: Secondary | ICD-10-CM | POA: Diagnosis not present

## 2023-11-03 DIAGNOSIS — Z7189 Other specified counseling: Secondary | ICD-10-CM | POA: Diagnosis not present

## 2023-11-03 DIAGNOSIS — H18413 Arcus senilis, bilateral: Secondary | ICD-10-CM | POA: Diagnosis not present

## 2023-11-03 DIAGNOSIS — M79642 Pain in left hand: Secondary | ICD-10-CM | POA: Diagnosis not present

## 2023-11-10 ENCOUNTER — Other Ambulatory Visit: Payer: Self-pay | Admitting: Nurse Practitioner

## 2023-11-10 DIAGNOSIS — D3502 Benign neoplasm of left adrenal gland: Secondary | ICD-10-CM

## 2023-11-12 DIAGNOSIS — Z79891 Long term (current) use of opiate analgesic: Secondary | ICD-10-CM | POA: Diagnosis not present

## 2023-11-12 DIAGNOSIS — M47816 Spondylosis without myelopathy or radiculopathy, lumbar region: Secondary | ICD-10-CM | POA: Diagnosis not present

## 2023-11-12 DIAGNOSIS — G894 Chronic pain syndrome: Secondary | ICD-10-CM | POA: Diagnosis not present

## 2023-11-12 DIAGNOSIS — M961 Postlaminectomy syndrome, not elsewhere classified: Secondary | ICD-10-CM | POA: Diagnosis not present

## 2023-12-08 ENCOUNTER — Other Ambulatory Visit: Payer: Medicare Other

## 2023-12-29 ENCOUNTER — Inpatient Hospital Stay: Admission: RE | Admit: 2023-12-29 | Source: Ambulatory Visit

## 2024-02-18 ENCOUNTER — Other Ambulatory Visit

## 2024-03-14 ENCOUNTER — Other Ambulatory Visit

## 2024-03-22 ENCOUNTER — Ambulatory Visit
Admission: RE | Admit: 2024-03-22 | Discharge: 2024-03-22 | Disposition: A | Discharge status: Home / Self Care | Source: Ambulatory Visit | Attending: Nurse Practitioner

## 2024-03-22 DIAGNOSIS — D3502 Benign neoplasm of left adrenal gland: Secondary | ICD-10-CM

## 2024-04-18 ENCOUNTER — Other Ambulatory Visit: Payer: Self-pay | Admitting: Nurse Practitioner

## 2024-04-18 ENCOUNTER — Ambulatory Visit
Admission: RE | Admit: 2024-04-18 | Discharge: 2024-04-18 | Disposition: A | Discharge status: Home / Self Care | Source: Ambulatory Visit | Attending: Nurse Practitioner | Admitting: Nurse Practitioner

## 2024-04-18 DIAGNOSIS — M7989 Other specified soft tissue disorders: Secondary | ICD-10-CM

## 2024-04-18 DIAGNOSIS — G8929 Other chronic pain: Secondary | ICD-10-CM

## 2024-06-07 ENCOUNTER — Other Ambulatory Visit (HOSPITAL_BASED_OUTPATIENT_CLINIC_OR_DEPARTMENT_OTHER): Payer: Self-pay | Admitting: Nurse Practitioner

## 2024-06-07 DIAGNOSIS — Z780 Asymptomatic menopausal state: Secondary | ICD-10-CM

## 2025-01-30 ENCOUNTER — Other Ambulatory Visit (HOSPITAL_BASED_OUTPATIENT_CLINIC_OR_DEPARTMENT_OTHER)
